# Patient Record
Sex: Female | Born: 1980 | Race: Black or African American | Hispanic: No | Marital: Married | Smoking: Never smoker
Health system: Southern US, Community
[De-identification: ages and names within clinical notes are randomized; demographics above are authoritative.]

## PROBLEM LIST (undated history)

## (undated) DIAGNOSIS — Z531 Procedure and treatment not carried out because of patient's decision for reasons of belief and group pressure: Secondary | ICD-10-CM

## (undated) DIAGNOSIS — L409 Psoriasis, unspecified: Secondary | ICD-10-CM

## (undated) DIAGNOSIS — IMO0001 Reserved for inherently not codable concepts without codable children: Secondary | ICD-10-CM

## (undated) HISTORY — PX: TONSILLECTOMY: SUR1361

## (undated) HISTORY — PX: WISDOM TOOTH EXTRACTION: SHX21

---

## 2013-06-02 ENCOUNTER — Ambulatory Visit (INDEPENDENT_AMBULATORY_CARE_PROVIDER_SITE_OTHER): Payer: BC Managed Care – PPO | Admitting: Surgery

## 2013-06-23 ENCOUNTER — Encounter (INDEPENDENT_AMBULATORY_CARE_PROVIDER_SITE_OTHER): Payer: Self-pay | Admitting: Surgery

## 2013-06-23 ENCOUNTER — Ambulatory Visit (INDEPENDENT_AMBULATORY_CARE_PROVIDER_SITE_OTHER): Payer: BC Managed Care – PPO | Admitting: Surgery

## 2013-06-23 DIAGNOSIS — E119 Type 2 diabetes mellitus without complications: Secondary | ICD-10-CM

## 2013-06-23 NOTE — Progress Notes (Signed)
Chief Complaint:  Morbid obesity BMI 43 with prediabetes  History of Present Illness:  Courtney Mcgee is an 33 y.o. female  who presents for consideration of bariatric surgery. She has completed her weight loss chart with weights ranging from 290 11/03/2017 over the past 2 years. Her BMI is range from 40-43. She has had at least 3 years about exercise without sustainable result. She has a letter from [employer] who is her first practitioner and no blood health concurs we'll further supporting her need for bariatric surgery to contribute to weight loss. Patient also presents her personal statement indicating her family history of strong for diabetes and her desire to not have further children are also to avoid the sustained ravages of long-term diabetes. She has tried multiple attempts with behavior modification Weight Watchers Adkins type diet Slim carried AT&TSlim fast again without sustainable result.  She's been to our  seminar and is interested in  having a sleeve gastrectomy.  I discussed the operation some detail with her and she would like to proceed.  She does not take blood transfusions.  History reviewed. No pertinent past medical history.  Past Surgical History  Procedure Laterality Date  . Cesarean section    . Tonsillectomy and adenoidectomy      Current Outpatient Prescriptions  Medication Sig Dispense Refill  . metFORMIN (GLUCOPHAGE-XR) 750 MG 24 hr tablet       . SPRINTEC 28 0.25-35 MG-MCG tablet        No current facility-administered medications for this visit.   Review of patient's allergies indicates no known allergies. Family History  Problem Relation Age of Onset  . Heart disease Father    Social History:   reports that she has never smoked. She does not have any smokeless tobacco history on file. She reports that she does not drink alcohol or use illicit drugs.   REVIEW OF SYSTEMS - PERTINENT POSITIVES ONLY: No history of DVT. Prior cesarean section. No  history of GERD  Physical Exam:   Blood pressure 128/80, pulse 67, temperature 97.8 F (36.6 C), resp. rate 16, height 6' (1.829 m), weight 319 lb (144.697 kg). Body mass index is 43.25 kg/(m^2).  Gen:  WDWN African American female NAD  Neurological: Alert and oriented to person, place, and time. Motor and sensory function is grossly intact  Head: Normocephalic and atraumatic.  Eyes: Conjunctivae are normal. Pupils are equal, round, and reactive to light. No scleral icterus.  Neck: Normal range of motion. Neck supple. No tracheal deviation or thyromegaly present.  Cardiovascular:  SR without murmurs or gallops.  No carotid bruits Respiratory: Effort normal.  No respiratory distress. No chest wall tenderness. Breath sounds normal.  No wheezes, rales or rhonchi.  Abdomen:  Obese nontender GU: Musculoskeletal: Normal range of motion. Extremities are nontender. No cyanosis, edema or clubbing noted Lymphadenopathy: No cervical, preauricular, postauricular or axillary adenopathy is present Skin: Skin is warm and dry. No rash noted. No diaphoresis. No erythema. No pallor. Pscyh: Normal mood and affect. Behavior is normal. Judgment and thought content normal.   LABORATORY RESULTS: No results found for this or any previous visit (from the past 48 hour(s)).  RADIOLOGY RESULTS: No results found.  Problem List: There are no active problems to display for this patient.   Assessment & Plan: For morbid obesity BMI 43 in a patient who would be a good candidate for sleeve gastrectomy. We'll proceed with workup.    Matt B. Daphine DeutscherMartin, MD, [facility]  [facility] [phone] beeper [phone]  06/23/2013 3:51 PM

## 2013-06-23 NOTE — Patient Instructions (Signed)
Sleeve Gastrectomy A sleeve gastrectomy is a surgery in which a large portion of the stomach is removed. After the surgery, the stomach will be a narrow tube about the size of a banana. This surgery is performed to help a person lose weight. The person loses weight because the reduced size of the stomach restricts the amount of food that the person can eat. The stomach will hold much less food than before the surgery. Also, the part of the stomach that is removed produces a hormone that causes hunger.  This surgery is done for people who have morbid obesity, defined as a body mass index (BMI) greater than 40. BMI is an estimate of body fat and is calculated from the height and weight of a person. This surgery may also be done for people with a BMI between 35 and 40 if they have other diseases, such as type 2 diabetes mellitus, obstructive sleep apnea, or heart and lung disorders (cardiopulmonary diseases).  LET YOUR HEALTH CARE PROVIDER KNOW ABOUT:  Any allergies you have.   All medicines you are taking, including vitamins, herbs, eyedrops, creams, and over-the-counter medicines.   Use of steroids (by mouth or creams).   Previous problems you or members of your family have had with the use of anesthetics.   Any blood disorders you have.   Previous surgeries you have had.   Possibility of pregnancy, if this applies.   Other health problems you have. RISKS AND COMPLICATIONS Generally, sleeve gastrectomy is a safe procedure. However, as with any procedure, complications can occur. Possible complications include:  Infection.  Bleeding.  Blood clots.  Damage to other organs or tissue.  Leakage of fluid from the stomach into the abdominal cavity (rare). BEFORE THE PROCEDURE  You may need to have blood tests and imaging tests (such as X-rays or ultrasonography) done before the day of surgery. A test to evaluate your esophagus and how it moves (esophageal manometry) may also be  done.  You may be placed on a liquid diet 2 3 weeks before the surgery.  Ask your health care provider about changing or stopping your regular medicines.  Do not eat or drink anything for at least 8 hours before the procedure.   Make plans to have someone drive you home after your hospital stay. Also arrange for someone to help you with activities during recovery. PROCEDURE  A laparoscopic technique is usually used for this surgery:  You will be given medicine to make you sleep through the procedure (general anesthetic). This medicine will be given through an intravenous (IV) access tube that is put into one of your veins.  Once you are asleep, your abdomen will be cleaned and sterilized.  Several small incisions will be made in your abdomen.  Your abdomen will be filled with air so that it expands. This gives the surgeon more room to operate and makes your organs easier to see.  A thin, lighted tube with a tiny camera on the end (laparoscope) is put through a small incision in your abdomen. The camera on the laparoscope sends a picture to a TV screen in the operating room. This gives the surgeon a good view inside the abdomen.  Hollow tubes are put through the other small incisions in your abdomen. The tools needed for the procedure are put through these tubes.  The surgeon uses staples to divide part of the stomach and then removes it through one of the incisions.  The remaining stomach may be reinforced using   stitches or surgical glue or both to prevent leakage of the stomach contents. A small tube (drain) may be placed through one of the incisions to allow extra fluid to flow from the area.  The incisions are closed with stitches, staples, or glue. AFTER THE PROCEDURE  You will be monitored closely in a recovery area. Once the anesthetic has worn off, you will likely be moved to a regular hospital room.  You will be given medicine for pain and nausea.   You may have a drain  from one of the incisions in your abdomen. If a drain is used, it may stay in place after you go home from the hospital and be removed at a follow-up appointment.   You will be encouraged to walk around several times a day. This helps prevent blood clots.  You will be started on a liquid diet the first day after your surgery. Sometimes a test is done to check for leaking before you can eat.  You will be urged to cough and do deep breathing exercises. This helps prevent a lung infection after a surgery.  You will likely need to stay in the hospital for a few days.  Document Released: 11/25/2008 Document Revised: 09/30/2012 Document Reviewed: 06/12/2012 ExitCare Patient Information 2014 [employer].  Two weeks prior to surgery  Go on the extremely low carb liquid diet One week prior to surgery  No aspirin products.  Tylenol is acceptable  Stop smoking 24 hours prior to surgery  No alcoholic beverages  Report fever greater than 100.5 or excessive nasal drainage suggesting infection  Continue bariatric preop diet  Perform bowel prep if ordered  Do not eat or drink anything after midnight the night before surgery  Do not take any medications except those instructed by the anesthesiologist Morning of surgery  Please arrive at the hospital at least 2 hours before your scheduled surgery time.  No makeup, fingernail polish or jewelry  Bring insurance cards with you  Bring your CPAP mask if you use this   

## 2013-07-08 ENCOUNTER — Encounter: Payer: Self-pay | Admitting: Dietician

## 2013-07-08 ENCOUNTER — Encounter: Payer: BC Managed Care – PPO | Attending: Surgery | Admitting: Dietician

## 2013-07-08 VITALS — Ht 72.0 in | Wt 315.7 lb

## 2013-07-08 DIAGNOSIS — Z01818 Encounter for other preprocedural examination: Secondary | ICD-10-CM | POA: Insufficient documentation

## 2013-07-08 DIAGNOSIS — E669 Obesity, unspecified: Secondary | ICD-10-CM

## 2013-07-08 DIAGNOSIS — Z713 Dietary counseling and surveillance: Secondary | ICD-10-CM | POA: Insufficient documentation

## 2013-07-08 NOTE — Progress Notes (Signed)
  Pre-Op Assessment Visit:  Pre-Operative Sleeve Gastrectomy Surgery  Medical Nutrition Therapy:  Appt start time: 1630   End time:  1700.  Patient was seen on 07/08/2013 for Pre-Operative Sleeve Gastrectomy Nutrition Assessment. Assessment and letter of approval faxed to [facility] Bariatric Surgery Program coordinator on 07/08/2013.   Preferred Learning Style:   No preference indicated   Learning Readiness:   Ready  Handouts given during visit include:  Pre-Op Goals Bariatric Surgery Protein Shakes  Teaching Method Utilized:  Visual Auditory Hands on  Barriers to learning/adherence to lifestyle change: none  Demonstrated degree of understanding via:  Teach Back   Patient to call the [facility] to enroll in Pre-Op and Post-Op Nutrition Education when surgery date is scheduled.

## 2013-07-08 NOTE — Patient Instructions (Signed)
Start working on [employer]. Try protein shakes. Call [facility] once surgery is scheduled to enroll in Pre-Op Class.

## 2013-07-14 ENCOUNTER — Other Ambulatory Visit: Payer: Self-pay

## 2013-07-14 ENCOUNTER — Ambulatory Visit (HOSPITAL_COMMUNITY)
Admission: RE | Admit: 2013-07-14 | Discharge: 2013-07-14 | Disposition: A | Discharge status: Home / Self Care | Payer: BC Managed Care – PPO | Source: Ambulatory Visit | Attending: Surgery | Admitting: Surgery

## 2013-07-14 DIAGNOSIS — K8020 Calculus of gallbladder without cholecystitis without obstruction: Secondary | ICD-10-CM | POA: Insufficient documentation

## 2013-07-14 DIAGNOSIS — Z6841 Body Mass Index (BMI) 40.0 and over, adult: Secondary | ICD-10-CM | POA: Insufficient documentation

## 2013-07-14 DIAGNOSIS — E119 Type 2 diabetes mellitus without complications: Secondary | ICD-10-CM | POA: Insufficient documentation

## 2013-07-14 DIAGNOSIS — K7689 Other specified diseases of liver: Secondary | ICD-10-CM | POA: Insufficient documentation

## 2013-07-16 ENCOUNTER — Encounter (HOSPITAL_COMMUNITY)
Admission: RE | Disposition: A | Discharge status: Home / Self Care | Payer: Self-pay | Source: Ambulatory Visit | Attending: Surgery

## 2013-07-16 ENCOUNTER — Ambulatory Visit (HOSPITAL_COMMUNITY)
Admission: RE | Admit: 2013-07-16 | Discharge: 2013-07-16 | Disposition: A | Discharge status: Home / Self Care | Payer: BC Managed Care – PPO | Source: Ambulatory Visit | Attending: Surgery | Admitting: Surgery

## 2013-07-16 DIAGNOSIS — Z0189 Encounter for other specified special examinations: Secondary | ICD-10-CM | POA: Insufficient documentation

## 2013-07-16 HISTORY — PX: BREATH TEK H PYLORI: SHX5422

## 2013-07-16 LAB — CBC WITH DIFFERENTIAL/PLATELET
BASOS ABS: 0 10*3/uL (ref 0.0–0.1)
Basophils Relative: 0 % (ref 0–1)
Eosinophils Absolute: 0.2 10*3/uL (ref 0.0–0.7)
Eosinophils Relative: 2 % (ref 0–5)
HCT: 35.8 % — ABNORMAL LOW (ref 36.0–46.0)
Hemoglobin: 11.7 g/dL — ABNORMAL LOW (ref 12.0–15.0)
Lymphocytes Relative: 27 % (ref 12–46)
Lymphs Abs: 2.8 10*3/uL (ref 0.7–4.0)
MCH: 27.3 pg (ref 26.0–34.0)
MCHC: 32.7 g/dL (ref 30.0–36.0)
MCV: 83.6 fL (ref 78.0–100.0)
MONO ABS: 0.3 10*3/uL (ref 0.1–1.0)
Monocytes Relative: 3 % (ref 3–12)
Neutro Abs: 6.9 10*3/uL (ref 1.7–7.7)
Neutrophils Relative %: 68 % (ref 43–77)
PLATELETS: 217 10*3/uL (ref 150–400)
RBC: 4.28 MIL/uL (ref 3.87–5.11)
RDW: 14.3 % (ref 11.5–15.5)
WBC: 10.2 10*3/uL (ref 4.0–10.5)

## 2013-07-16 LAB — COMPREHENSIVE METABOLIC PANEL
ALK PHOS: 74 U/L (ref 39–117)
ALT: 17 U/L (ref 0–35)
AST: 12 U/L (ref 0–37)
Albumin: 3.7 g/dL (ref 3.5–5.2)
BUN: 10 mg/dL (ref 6–23)
CO2: 23 mEq/L (ref 19–32)
CREATININE: 0.74 mg/dL (ref 0.50–1.10)
Calcium: 8.9 mg/dL (ref 8.4–10.5)
Chloride: 105 mEq/L (ref 96–112)
Glucose, Bld: 84 mg/dL (ref 70–99)
Potassium: 4.1 mEq/L (ref 3.5–5.3)
Sodium: 140 mEq/L (ref 135–145)
Total Bilirubin: 0.4 mg/dL (ref 0.2–1.2)
Total Protein: 7 g/dL (ref 6.0–8.3)

## 2013-07-16 LAB — TSH: TSH: 1.879 u[IU]/mL (ref 0.350–4.500)

## 2013-07-16 LAB — HEMOGLOBIN A1C
Hgb A1c MFr Bld: 5.3 % (ref <5.7)
MEAN PLASMA GLUCOSE: 105 mg/dL (ref <117)

## 2013-07-16 LAB — T4: T4, Total: 12.7 ug/dL — ABNORMAL HIGH (ref 5.0–12.5)

## 2013-07-16 SURGERY — BREATH TEST, FOR HELICOBACTER PYLORI

## 2013-07-16 NOTE — Progress Notes (Signed)
07/16/13 1041  BREATH TEK ASSESSMENT  Referring MD Luretha Murphy  Time of Last PO Intake 2100 (on 07/15/2013)  Baseline Breath At: 1000  Pranactin Given At: 1001  Post-Dose Breath At: 1016  Sample 1 1.2  Sample 2 1.2  Test Negative

## 2013-07-17 LAB — HCG, SERUM, QUALITATIVE: Preg, Serum: NEGATIVE

## 2013-07-19 ENCOUNTER — Encounter (HOSPITAL_COMMUNITY): Payer: Self-pay | Admitting: Surgery

## 2013-08-04 ENCOUNTER — Telehealth: Payer: Self-pay | Admitting: Dietician

## 2013-08-04 NOTE — Telephone Encounter (Signed)
The pre op diet handout was emailed to the patient on 08/04/2013. She was instructed to begin the pre op diet at 2 weeks prior to surgery.

## 2013-08-09 ENCOUNTER — Ambulatory Visit: Payer: Self-pay | Admitting: Dietician

## 2013-08-12 NOTE — Progress Notes (Signed)
Need orders in EPIC.  Surgery on 08/30/13.  proep on 08/20/13 at 230pm.  Thank You.

## 2013-08-13 ENCOUNTER — Encounter (HOSPITAL_COMMUNITY): Payer: Self-pay | Admitting: Pharmacy Technician

## 2013-08-19 ENCOUNTER — Other Ambulatory Visit (HOSPITAL_COMMUNITY): Payer: Self-pay | Admitting: *Deleted

## 2013-08-19 NOTE — Patient Instructions (Addendum)
Courtney Mcgee  08/19/2013                           YOUR PROCEDURE IS SCHEDULED ON: 08/30/13 AT 10:45 AM               ENTER THRU  MAIN HOSPITAL ENTRANCE AND                            FOLLOW  SIGNS TO SHORT STAY CENTER                 ARRIVE AT SHORT STAY AT: 8:45 AM               CALL THIS NUMBER IF ANY PROBLEMS THE DAY OF SURGERY :               [phone]                                REMEMBER:   Do not eat food or drink liquids AFTER MIDNIGHT                 Take these medicines the morning of surgery with               A SIPS OF WATER : BIRTH CONTROL PILL        Do not wear jewelry, make-up   Do not wear lotions, powders, or perfumes.   Do not shave legs or underarms 12 hrs. before surgery (men may shave face)  Do not bring valuables to the hospital.  Contacts, dentures or bridgework may not be worn into surgery.  Leave suitcase in the car. After surgery it may be brought to your room.  For patients admitted to the hospital more than one night, checkout time is            11:00 AM                                         ________________________________________________________________________                                                         [place] - PREPARING FOR SURGERY  Before surgery, you can play an important role.  Because skin is not sterile, your skin needs to be as free of germs as possible.  You can reduce the number of germs on your skin by washing with CHG (chlorahexidine gluconate) soap before surgery.  CHG is an antiseptic cleaner which kills germs and bonds with the skin to continue killing germs even after washing. Please DO NOT use if you have an allergy to CHG or antibacterial soaps.  If your skin becomes reddened/irritated stop using the CHG and inform your nurse when you arrive at Short Stay. Do not shave (including legs and underarms) for at least 48 hours prior to the first CHG shower.  You may shave your  face. Please follow these instructions carefully:   1.  Shower with CHG Soap the night before surgery and the  morning of Surgery.  2.  If you choose to wash your hair, wash your hair first as usual with your  normal  Shampoo.   3.  After you shampoo, rinse your hair and body thoroughly to remove the  shampoo.                                         4.  Use CHG as you would any other liquid soap.  You can apply chg directly  to the skin and wash . Gently wash with scrungie or clean wascloth    5.  Apply the CHG Soap to your body ONLY FROM THE NECK DOWN.   Do not use on open                           Wound or open sores. Avoid contact with eyes, ears mouth and genitals (private parts).                        Genitals (private parts) with your normal soap.              6.  Wash thoroughly, paying special attention to the area where your surgery  will be performed.   7.  Thoroughly rinse your body with warm water from the neck down.   8.  DO NOT shower/wash with your normal soap after using and rinsing off  the CHG Soap .                9.  Pat yourself dry with a clean towel.             10.  Wear clean pajamas.             11.  Place clean sheets on your bed the night of your first shower and do not  sleep with pets.  Day of Surgery : Do not apply any lotions/deodorants the morning of surgery.  Please wear clean clothes to the hospital/surgery center.  FAILURE TO FOLLOW THESE INSTRUCTIONS MAY RESULT IN THE CANCELLATION OF YOUR SURGERY    PATIENT SIGNATURE_________________________________  ______________________________________________________________________

## 2013-08-20 ENCOUNTER — Encounter (HOSPITAL_COMMUNITY): Payer: Self-pay

## 2013-08-20 ENCOUNTER — Encounter (HOSPITAL_COMMUNITY)
Admission: RE | Admit: 2013-08-20 | Discharge: 2013-08-20 | Disposition: A | Discharge status: Home / Self Care | Payer: BC Managed Care – PPO | Source: Ambulatory Visit | Attending: Surgery | Admitting: Surgery

## 2013-08-20 DIAGNOSIS — Z01812 Encounter for preprocedural laboratory examination: Secondary | ICD-10-CM | POA: Insufficient documentation

## 2013-08-20 HISTORY — DX: Procedure and treatment not carried out because of patient's decision for reasons of belief and group pressure: Z53.1

## 2013-08-20 HISTORY — DX: Reserved for inherently not codable concepts without codable children: IMO0001

## 2013-08-20 HISTORY — DX: Psoriasis, unspecified: L40.9

## 2013-08-20 LAB — CBC
HCT: 36.1 % (ref 36.0–46.0)
HEMOGLOBIN: 11.6 g/dL — AB (ref 12.0–15.0)
MCH: 27.6 pg (ref 26.0–34.0)
MCHC: 32.1 g/dL (ref 30.0–36.0)
MCV: 86 fL (ref 78.0–100.0)
Platelets: 208 10*3/uL (ref 150–400)
RBC: 4.2 MIL/uL (ref 3.87–5.11)
RDW: 13.9 % (ref 11.5–15.5)
WBC: 10.1 10*3/uL (ref 4.0–10.5)

## 2013-08-20 LAB — COMPREHENSIVE METABOLIC PANEL
ALK PHOS: 73 U/L (ref 39–117)
ALT: 19 U/L (ref 0–35)
AST: 16 U/L (ref 0–37)
Albumin: 3.4 g/dL — ABNORMAL LOW (ref 3.5–5.2)
Anion gap: 15 (ref 5–15)
BUN: 14 mg/dL (ref 6–23)
CO2: 24 mEq/L (ref 19–32)
Calcium: 9.3 mg/dL (ref 8.4–10.5)
Chloride: 102 mEq/L (ref 96–112)
Creatinine, Ser: 0.69 mg/dL (ref 0.50–1.10)
GFR calc Af Amer: >90 mL/min (ref >90)
GFR calc non Af Amer: >90 mL/min (ref >90)
Glucose, Bld: 82 mg/dL (ref 70–99)
POTASSIUM: 4.2 meq/L (ref 3.7–5.3)
SODIUM: 141 meq/L (ref 137–147)
Total Protein: 7.7 g/dL (ref 6.0–8.3)

## 2013-08-20 LAB — HCG, SERUM, QUALITATIVE: Preg, Serum: NEGATIVE

## 2013-08-20 NOTE — Progress Notes (Signed)
PT is Jehovahs witness - no  Blood products for signed - noted in EPIC - pt states Dr. Daphine DeutscherMartin aware

## 2013-08-23 ENCOUNTER — Encounter: Payer: BC Managed Care – PPO | Attending: Surgery

## 2013-08-23 DIAGNOSIS — Z713 Dietary counseling and surveillance: Secondary | ICD-10-CM | POA: Insufficient documentation

## 2013-08-23 DIAGNOSIS — Z01818 Encounter for other preprocedural examination: Secondary | ICD-10-CM | POA: Insufficient documentation

## 2013-08-23 LAB — NO BLOOD PRODUCTS

## 2013-08-24 ENCOUNTER — Encounter (INDEPENDENT_AMBULATORY_CARE_PROVIDER_SITE_OTHER): Payer: Self-pay | Admitting: Surgery

## 2013-08-24 ENCOUNTER — Ambulatory Visit (INDEPENDENT_AMBULATORY_CARE_PROVIDER_SITE_OTHER): Payer: BC Managed Care – PPO | Admitting: Surgery

## 2013-08-24 VITALS — BP 126/80 | HR 74 | Temp 98.0°F | Ht 72.0 in | Wt 309.0 lb

## 2013-08-24 DIAGNOSIS — K8020 Calculus of gallbladder without cholecystitis without obstruction: Secondary | ICD-10-CM

## 2013-08-24 MED ORDER — HYDROCODONE-ACETAMINOPHEN 7.5-325 MG/15ML PO SOLN: 15.0000 mL | ORAL | Status: DC | PRN

## 2013-08-24 NOTE — Progress Notes (Signed)
Chief Complaint:  Morbid obesity weight 308 BMI 42  History of Present Illness:  Courtney Mcgee is an 33 y.o. female who is ready for her upcoming sleeve gastrectomy on Monday.  She is aware of the operation and its risks and benefits.  She is a Scientist, product/process development.  She did have a gallstone on her preop ultrasound.    Past Medical History  Diagnosis Date  . Psoriasis   . Refusal of blood transfusions as patient is Jehovah's Witness     Past Surgical History  Procedure Laterality Date  . Cesarean section    . Breath tek h pylori N/A 07/16/2013    Procedure: BREATH TEK H PYLORI;  Surgeon: Valarie Merino, MD;  Location: [facility];  Service: General;  Laterality: N/A;  . Tonsillectomy    . Wisdom tooth extraction      Current Outpatient Prescriptions  Medication Sig Dispense Refill  . cetirizine (ZYRTEC) 10 MG tablet Take 10 mg by mouth daily as needed for allergies.      Marland Kitchen HYDROcodone-acetaminophen (NORCO/VICODIN) 5-325 MG per tablet Take 1 tablet by mouth every 6 (six) hours as needed (Cycle pain).      Marland Kitchen ibuprofen (ADVIL,MOTRIN) 800 MG tablet Take 800 mg by mouth every 8 (eight) hours as needed (Pain).      . SPRINTEC 28 0.25-35 MG-MCG tablet Take 1 tablet by mouth daily.       . Vitamin D, Ergocalciferol, (DRISDOL) 50000 UNITS CAPS capsule Take 50,000 Units by mouth every 7 (seven) days. Thursdays      . HYDROcodone-acetaminophen (HYCET) 7.5-325 mg/15 ml solution Take 15 mLs by mouth every 4 (four) hours as needed for moderate pain.  300 mL  0   No current facility-administered medications for this visit.   Review of patient's allergies indicates no known allergies. Family History  Problem Relation Age of Onset  . Heart disease Father   . Hypertension Other   . Hyperlipidemia Other   . Heart disease Other   . Diabetes Other    Social History:   reports that she has never smoked. She does not have any smokeless tobacco history on file. She reports that she does not drink  alcohol or use illicit drugs.   REVIEW OF SYSTEMS : Positive for gallstones ; otherwise negative  Physical Exam:   Blood pressure 126/80, pulse 74, temperature 98 F (36.7 C), height 6' (1.829 m), weight 309 lb (140.161 kg), last menstrual period 08/18/2013. Body mass index is 41.9 kg/(m^2).  Gen:  WDWN AAF NAD  Neurological: Alert and oriented to person, place, and time. Motor and sensory function is grossly intact  Head: Normocephalic and atraumatic.  Eyes: Conjunctivae are normal. Pupils are equal, round, and reactive to light. No scleral icterus.  Neck: Normal range of motion. Neck supple. No tracheal deviation or thyromegaly present.  Cardiovascular:  SR without murmurs or gallops.  No carotid bruits Breast:  Not examined Respiratory: Effort normal.  No respiratory distress. No chest wall tenderness. Breath sounds normal.  No wheezes, rales or rhonchi.  Abdomen:  obese GU:  Not examined Musculoskeletal: Normal range of motion. Extremities are nontender. No cyanosis, edema or clubbing noted Lymphadenopathy: No cervical, preauricular, postauricular or axillary adenopathy is present Skin: Skin is warm and dry. No rash noted. No diaphoresis. No erythema. No pallor. Pscyh: Normal mood and affect. Behavior is normal. Judgment and thought content normal.   LABORATORY RESULTS: No results found for this or any previous visit (from the past  48 hour(s)).   RADIOLOGY RESULTS: No results found.  Problem List: There are no active problems to display for this patient.   Assessment & Plan: Morbid obesity for sleeve gastrectomy.  Normal UGI series.      Matt B. Daphine DeutscherMartin, MD, [facility]  [facility] [phone] beeper [phone]  08/24/2013 11:24 AM

## 2013-08-24 NOTE — Patient Instructions (Signed)

## 2013-08-25 NOTE — Progress Notes (Signed)
  Pre-Operative Nutrition Class:  Appt start time: 4718   End time:  1830.  Patient was seen on 08/23/13 for Pre-Operative Bariatric Surgery Education at [facility].   Surgery date: 08/30/13 Surgery type: gastric sleeve Start weight at [facility]: 316 lbs on 07/08/13 Weight today: 309.5 lbs  TANITA  BODY COMP RESULTS  08/23/13   BMI (kg/m^2) 42   Fat Mass (lbs) 159   Fat Free Mass (lbs) 150.5   Total Body Water (lbs) 110   Samples given per MNT protocol. Patient educated on appropriate usage: Premier protein shake (strawberry - qty 1) Lot #: [id number] Exp: 06/2014  Bariatric Advantage Calcium Citrate (chocolate - qty 1) Lot #: [id number] Exp: 07/2014  Celebrate Vitamins Multivitamin (orange - qty 1) Lot #: [id number] Exp: 03/2014  Renee Pain Protein Powder (vanilla - qty 1) Lot #: [id number] Exp: 11/2014  The following the learning objectives were met by the patient during this course:  Identify Pre-Op Dietary Goals and will begin 2 weeks pre-operatively  Identify appropriate sources of fluids and proteins   State protein recommendations and appropriate sources pre and post-operatively  Identify Post-Operative Dietary Goals and will follow for 2 weeks post-operatively  Identify appropriate multivitamin and calcium sources  Describe the need for physical activity post-operatively and will follow MD recommendations  State when to call healthcare provider regarding medication questions or post-operative complications  Handouts given during class include:  Pre-Op Bariatric Surgery Diet Handout  Protein Shake Handout  Post-Op Bariatric Surgery Nutrition Handout  BELT Program Information Flyer  Support Group Information Flyer  [facility] Bariatric Supplements Price List  Follow-Up Plan: Patient will follow-up at [facility] 2 weeks post operatively for diet advancement per MD.

## 2013-08-30 ENCOUNTER — Encounter (HOSPITAL_COMMUNITY): Payer: BC Managed Care – PPO | Admitting: *Deleted

## 2013-08-30 ENCOUNTER — Inpatient Hospital Stay (HOSPITAL_COMMUNITY)
Admission: RE | Admit: 2013-08-30 | Discharge: 2013-09-01 | DRG: 621 | Disposition: A | Discharge status: Home / Self Care | Payer: BC Managed Care – PPO | Source: Ambulatory Visit | Attending: Surgery | Admitting: Surgery

## 2013-08-30 ENCOUNTER — Ambulatory Visit (HOSPITAL_COMMUNITY): Payer: BC Managed Care – PPO | Admitting: *Deleted

## 2013-08-30 ENCOUNTER — Inpatient Hospital Stay (HOSPITAL_COMMUNITY): Admission: RE | Admit: 2013-08-30 | Payer: BC Managed Care – PPO | Source: Ambulatory Visit | Admitting: Surgery

## 2013-08-30 ENCOUNTER — Encounter (HOSPITAL_COMMUNITY): Payer: Self-pay | Admitting: *Deleted

## 2013-08-30 ENCOUNTER — Encounter (HOSPITAL_COMMUNITY)
Admission: RE | Disposition: A | Discharge status: Home / Self Care | Payer: Self-pay | Source: Ambulatory Visit | Attending: Surgery

## 2013-08-30 DIAGNOSIS — Z791 Long term (current) use of non-steroidal anti-inflammatories (NSAID): Secondary | ICD-10-CM

## 2013-08-30 DIAGNOSIS — Z79899 Other long term (current) drug therapy: Secondary | ICD-10-CM

## 2013-08-30 DIAGNOSIS — Z9049 Acquired absence of other specified parts of digestive tract: Secondary | ICD-10-CM

## 2013-08-30 DIAGNOSIS — K8020 Calculus of gallbladder without cholecystitis without obstruction: Secondary | ICD-10-CM | POA: Diagnosis present

## 2013-08-30 DIAGNOSIS — L408 Other psoriasis: Secondary | ICD-10-CM | POA: Diagnosis present

## 2013-08-30 DIAGNOSIS — Z6841 Body Mass Index (BMI) 40.0 and over, adult: Secondary | ICD-10-CM

## 2013-08-30 DIAGNOSIS — Z833 Family history of diabetes mellitus: Secondary | ICD-10-CM

## 2013-08-30 DIAGNOSIS — Z8249 Family history of ischemic heart disease and other diseases of the circulatory system: Secondary | ICD-10-CM

## 2013-08-30 DIAGNOSIS — Z9884 Bariatric surgery status: Secondary | ICD-10-CM

## 2013-08-30 DIAGNOSIS — Z01812 Encounter for preprocedural laboratory examination: Secondary | ICD-10-CM

## 2013-08-30 HISTORY — PX: LAPAROSCOPIC GASTRIC SLEEVE RESECTION: SHX5895

## 2013-08-30 LAB — HEMOGLOBIN AND HEMATOCRIT, BLOOD
HCT: 36.2 % (ref 36.0–46.0)
Hemoglobin: 11.8 g/dL — ABNORMAL LOW (ref 12.0–15.0)

## 2013-08-30 LAB — CREATININE, SERUM
Creatinine, Ser: 0.75 mg/dL (ref 0.50–1.10)
GFR calc Af Amer: >90 mL/min (ref >90)
GFR calc non Af Amer: >90 mL/min (ref >90)

## 2013-08-30 SURGERY — GASTRECTOMY, SLEEVE, LAPAROSCOPIC
Anesthesia: General | Site: Abdomen

## 2013-08-30 MED ORDER — MIDAZOLAM HCL 2 MG/2ML IJ SOLN
INTRAMUSCULAR | Status: AC
  Filled 2013-08-30: qty 2

## 2013-08-30 MED ORDER — ONDANSETRON HCL 4 MG/2ML IJ SOLN
INTRAMUSCULAR | Status: AC
  Filled 2013-08-30: qty 2

## 2013-08-30 MED ORDER — UNJURY VANILLA POWDER
2.0000 [oz_av] | Freq: Four times a day (QID) | ORAL | Status: DC
  Administered 2013-09-01: 2 [oz_av] via ORAL

## 2013-08-30 MED ORDER — LACTATED RINGERS IV SOLN: INTRAVENOUS | Status: DC

## 2013-08-30 MED ORDER — CEFOXITIN SODIUM 2 G IV SOLR
2.0000 g | INTRAVENOUS | Status: AC
  Administered 2013-08-30: 2 g via INTRAVENOUS

## 2013-08-30 MED ORDER — BUPIVACAINE LIPOSOME 1.3 % IJ SUSP
20.0000 mL | Freq: Once | INTRAMUSCULAR | Status: DC
  Filled 2013-08-30: qty 20

## 2013-08-30 MED ORDER — UNJURY CHICKEN SOUP POWDER
2.0000 [oz_av] | Freq: Four times a day (QID) | ORAL | Status: DC
  Administered 2013-09-01: 2 [oz_av] via ORAL

## 2013-08-30 MED ORDER — LACTATED RINGERS IR SOLN
Status: DC | PRN
  Administered 2013-08-30: 3000 mL

## 2013-08-30 MED ORDER — ONDANSETRON HCL 4 MG/2ML IJ SOLN: 4.0000 mg | INTRAMUSCULAR | Status: DC | PRN

## 2013-08-30 MED ORDER — UNJURY CHOCOLATE CLASSIC POWDER: 2.0000 [oz_av] | Freq: Four times a day (QID) | ORAL | Status: DC

## 2013-08-30 MED ORDER — DEXTROSE 5 % IV SOLN
INTRAVENOUS | Status: AC
  Filled 2013-08-30: qty 2

## 2013-08-30 MED ORDER — PROPOFOL 10 MG/ML IV BOLUS
INTRAVENOUS | Status: AC
  Filled 2013-08-30: qty 20

## 2013-08-30 MED ORDER — ACETAMINOPHEN 160 MG/5ML PO SOLN: 650.0000 mg | ORAL | Status: DC | PRN

## 2013-08-30 MED ORDER — NEOSTIGMINE METHYLSULFATE 10 MG/10ML IV SOLN
INTRAVENOUS | Status: DC | PRN
  Administered 2013-08-30: 4 mg via INTRAVENOUS

## 2013-08-30 MED ORDER — METOCLOPRAMIDE HCL 5 MG/ML IJ SOLN
INTRAMUSCULAR | Status: DC | PRN
  Administered 2013-08-30: 10 mg via INTRAVENOUS

## 2013-08-30 MED ORDER — ROCURONIUM BROMIDE 100 MG/10ML IV SOLN
INTRAVENOUS | Status: DC | PRN
  Administered 2013-08-30: 10 mg via INTRAVENOUS
  Administered 2013-08-30: 5 mg via INTRAVENOUS
  Administered 2013-08-30: 10 mg via INTRAVENOUS
  Administered 2013-08-30: 5 mg via INTRAVENOUS
  Administered 2013-08-30: 50 mg via INTRAVENOUS

## 2013-08-30 MED ORDER — MIDAZOLAM HCL 5 MG/5ML IJ SOLN
INTRAMUSCULAR | Status: DC | PRN
  Administered 2013-08-30 (×2): 1 mg via INTRAVENOUS

## 2013-08-30 MED ORDER — ESMOLOL HCL 10 MG/ML IV SOLN
INTRAVENOUS | Status: DC | PRN
  Administered 2013-08-30 (×2): 20 mg via INTRAVENOUS

## 2013-08-30 MED ORDER — ACETAMINOPHEN 160 MG/5ML PO SOLN
325.0000 mg | ORAL | Status: DC | PRN
  Administered 2013-08-31: 325 mg via ORAL
  Filled 2013-08-30: qty 20.3

## 2013-08-30 MED ORDER — BUPIVACAINE LIPOSOME 1.3 % IJ SUSP
INTRAMUSCULAR | Status: DC | PRN
  Administered 2013-08-30: 20 mL

## 2013-08-30 MED ORDER — 0.9 % SODIUM CHLORIDE (POUR BTL) OPTIME
TOPICAL | Status: DC | PRN
  Administered 2013-08-30: 1000 mL

## 2013-08-30 MED ORDER — LIDOCAINE HCL (CARDIAC) 20 MG/ML IV SOLN
INTRAVENOUS | Status: DC | PRN
  Administered 2013-08-30: 50 mg via INTRAVENOUS

## 2013-08-30 MED ORDER — HYDROMORPHONE HCL PF 1 MG/ML IJ SOLN: 0.2500 mg | INTRAMUSCULAR | Status: DC | PRN

## 2013-08-30 MED ORDER — ROCURONIUM BROMIDE 100 MG/10ML IV SOLN
INTRAVENOUS | Status: AC
  Filled 2013-08-30: qty 1

## 2013-08-30 MED ORDER — GLYCOPYRROLATE 0.2 MG/ML IJ SOLN
INTRAMUSCULAR | Status: DC | PRN
  Administered 2013-08-30: .6 mg via INTRAVENOUS

## 2013-08-30 MED ORDER — HEPARIN SODIUM (PORCINE) 5000 UNIT/ML IJ SOLN
5000.0000 [IU] | INTRAMUSCULAR | Status: AC
  Administered 2013-08-30: 5000 [IU] via SUBCUTANEOUS
  Filled 2013-08-30: qty 1

## 2013-08-30 MED ORDER — FENTANYL CITRATE 0.05 MG/ML IJ SOLN
INTRAMUSCULAR | Status: DC | PRN
  Administered 2013-08-30 (×2): 50 ug via INTRAVENOUS
  Administered 2013-08-30: 100 ug via INTRAVENOUS
  Administered 2013-08-30: 50 ug via INTRAVENOUS
  Administered 2013-08-30: 100 ug via INTRAVENOUS
  Administered 2013-08-30 (×3): 50 ug via INTRAVENOUS
  Administered 2013-08-30: 100 ug via INTRAVENOUS
  Administered 2013-08-30: 50 ug via INTRAVENOUS

## 2013-08-30 MED ORDER — CHLORHEXIDINE GLUCONATE 0.12 % MT SOLN
15.0000 mL | Freq: Two times a day (BID) | OROMUCOSAL | Status: DC
  Administered 2013-08-30 – 2013-08-31 (×2): 15 mL via OROMUCOSAL
  Filled 2013-08-30 (×6): qty 15

## 2013-08-30 MED ORDER — LACTATED RINGERS IV SOLN
INTRAVENOUS | Status: DC
  Administered 2013-08-30: 1000 mL via INTRAVENOUS

## 2013-08-30 MED ORDER — BIOTENE DRY MOUTH MT LIQD
15.0000 mL | Freq: Two times a day (BID) | OROMUCOSAL | Status: DC
  Administered 2013-08-30 – 2013-08-31 (×3): 15 mL via OROMUCOSAL

## 2013-08-30 MED ORDER — HEPARIN SODIUM (PORCINE) 5000 UNIT/ML IJ SOLN
5000.0000 [IU] | Freq: Three times a day (TID) | INTRAMUSCULAR | Status: DC
  Administered 2013-08-30 – 2013-09-01 (×6): 5000 [IU] via SUBCUTANEOUS
  Filled 2013-08-30 (×8): qty 1

## 2013-08-30 MED ORDER — FENTANYL CITRATE 0.05 MG/ML IJ SOLN
INTRAMUSCULAR | Status: AC
  Filled 2013-08-30: qty 5

## 2013-08-30 MED ORDER — DEXAMETHASONE SODIUM PHOSPHATE 10 MG/ML IJ SOLN
INTRAMUSCULAR | Status: DC | PRN
  Administered 2013-08-30: 5 mg via INTRAVENOUS

## 2013-08-30 MED ORDER — MORPHINE SULFATE 2 MG/ML IJ SOLN
2.0000 mg | INTRAMUSCULAR | Status: DC | PRN
  Administered 2013-08-30: 6 mg via INTRAVENOUS
  Administered 2013-08-30: 2 mg via INTRAVENOUS
  Administered 2013-08-30 (×2): 6 mg via INTRAVENOUS
  Administered 2013-08-30: 4 mg via INTRAVENOUS
  Administered 2013-08-30 – 2013-08-31 (×7): 6 mg via INTRAVENOUS
  Filled 2013-08-30: qty 1
  Filled 2013-08-30: qty 2
  Filled 2013-08-30 (×10): qty 3

## 2013-08-30 MED ORDER — LACTATED RINGERS IV SOLN
INTRAVENOUS | Status: DC | PRN
  Administered 2013-08-30 (×2): via INTRAVENOUS

## 2013-08-30 MED ORDER — PROMETHAZINE HCL 25 MG/ML IJ SOLN: 6.2500 mg | INTRAMUSCULAR | Status: DC | PRN

## 2013-08-30 MED ORDER — LIDOCAINE HCL (CARDIAC) 20 MG/ML IV SOLN
INTRAVENOUS | Status: AC
  Filled 2013-08-30: qty 5

## 2013-08-30 MED ORDER — KCL IN DEXTROSE-NACL 20-5-0.45 MEQ/L-%-% IV SOLN
INTRAVENOUS | Status: DC
  Administered 2013-08-30 – 2013-09-01 (×3): via INTRAVENOUS
  Filled 2013-08-30 (×7): qty 1000

## 2013-08-30 MED ORDER — ONDANSETRON HCL 4 MG/2ML IJ SOLN
INTRAMUSCULAR | Status: DC | PRN
  Administered 2013-08-30: 4 mg via INTRAVENOUS

## 2013-08-30 MED ORDER — OXYCODONE HCL 5 MG/5ML PO SOLN
5.0000 mg | ORAL | Status: DC | PRN
  Administered 2013-08-31: 5 mg via ORAL
  Administered 2013-08-31: 10 mg via ORAL
  Administered 2013-08-31: 5 mg via ORAL
  Administered 2013-08-31 – 2013-09-01 (×4): 10 mg via ORAL
  Filled 2013-08-30 (×2): qty 5
  Filled 2013-08-30 (×3): qty 10
  Filled 2013-08-30: qty 50
  Filled 2013-08-30: qty 10

## 2013-08-30 MED ORDER — PROPOFOL 10 MG/ML IV BOLUS
INTRAVENOUS | Status: DC | PRN
  Administered 2013-08-30: 230 mg via INTRAVENOUS

## 2013-08-30 SURGICAL SUPPLY — 65 items
APPLICATOR COTTON TIP 6IN STRL (MISCELLANEOUS) IMPLANT
APPLIER CLIP ROT 10 11.4 M/L (STAPLE)
APPLIER CLIP ROT 13.4 12 LRG (CLIP)
BLADE HEX COATED 2.75 (ELECTRODE) IMPLANT
BLADE SURG 15 STRL LF DISP TIS (BLADE) ×1 IMPLANT
BLADE SURG 15 STRL SS (BLADE) ×1
CABLE HIGH FREQUENCY MONO STRZ (ELECTRODE) IMPLANT
CLIP APPLIE ROT 10 11.4 M/L (STAPLE) IMPLANT
CLIP APPLIE ROT 13.4 12 LRG (CLIP) IMPLANT
DERMABOND ADVANCED (GAUZE/BANDAGES/DRESSINGS) ×1
DERMABOND ADVANCED .7 DNX12 (GAUZE/BANDAGES/DRESSINGS) ×1 IMPLANT
DEVICE SUT QUICK LOAD TK 5 (STAPLE) IMPLANT
DEVICE SUT TI-KNOT TK 5X26 (MISCELLANEOUS) IMPLANT
DEVICE SUTURE ENDOST 10MM (ENDOMECHANICALS) IMPLANT
DEVICE TROCAR PUNCTURE CLOSURE (ENDOMECHANICALS) ×2 IMPLANT
DISSECTOR BLUNT TIP ENDO 5MM (MISCELLANEOUS) IMPLANT
DRAIN CHANNEL 19F RND (DRAIN) IMPLANT
DRAPE CAMERA CLOSED 9X96 (DRAPES) ×2 IMPLANT
ELECT REM PT RETURN 9FT ADLT (ELECTROSURGICAL) ×2
ELECTRODE REM PT RTRN 9FT ADLT (ELECTROSURGICAL) ×1 IMPLANT
ENDO GIA UNIVERSAL XLG (ENDOMECHANICALS) IMPLANT
EVACUATOR SILICONE 100CC (DRAIN) IMPLANT
GAUZE SPONGE 4X4 12PLY STRL (GAUZE/BANDAGES/DRESSINGS) IMPLANT
GLOVE BIOGEL M 8.0 STRL (GLOVE) ×2 IMPLANT
GLOVE BIOGEL PI IND STRL 7.5 (GLOVE) ×1 IMPLANT
GLOVE BIOGEL PI INDICATOR 7.5 (GLOVE) ×1
GLOVE SURG SIGNA 7.5 PF LTX (GLOVE) ×2 IMPLANT
GLOVE SURG SS PI 6.5 STRL IVOR (GLOVE) ×8 IMPLANT
GLOVE SURG SS PI 7.0 STRL IVOR (GLOVE) ×2 IMPLANT
GOWN STRL REUS W/TWL XL LVL3 (GOWN DISPOSABLE) ×6 IMPLANT
GOWN STRL REUS W/TWL XL LVL4 (GOWN DISPOSABLE) ×4 IMPLANT
HOVERMATT SINGLE USE (MISCELLANEOUS) ×2 IMPLANT
KIT BASIN OR (CUSTOM PROCEDURE TRAY) ×2 IMPLANT
NEEDLE SPNL 22GX3.5 QUINCKE BK (NEEDLE) ×2 IMPLANT
PACK UNIVERSAL I (CUSTOM PROCEDURE TRAY) ×2 IMPLANT
PEN SKIN MARKING BROAD (MISCELLANEOUS) ×2 IMPLANT
RELOAD ENDO STITCH (ENDOMECHANICALS) IMPLANT
RELOAD TRI 45 ART MED THCK BLK (STAPLE) ×2 IMPLANT
RELOAD TRI 45 ART MED THCK PUR (STAPLE) IMPLANT
RELOAD TRI 60 ART MED THCK BLK (STAPLE) ×4 IMPLANT
RELOAD TRI 60 ART MED THCK PUR (STAPLE) ×8 IMPLANT
SCISSORS LAP 5X45 EPIX DISP (ENDOMECHANICALS) ×2 IMPLANT
SCRUB PCMX 4 OZ (MISCELLANEOUS) ×4 IMPLANT
SEALANT SURGICAL APPL DUAL CAN (MISCELLANEOUS) IMPLANT
SET IRRIG TUBING LAPAROSCOPIC (IRRIGATION / IRRIGATOR) ×2 IMPLANT
SLEEVE ADV FIXATION 12X100MM (TROCAR) IMPLANT
SLEEVE ADV FIXATION 5X100MM (TROCAR) ×4 IMPLANT
SLEEVE GASTRECTOMY 36FR VISIGI (MISCELLANEOUS) ×2 IMPLANT
SOLUTION ANTI FOG 6CC (MISCELLANEOUS) ×2 IMPLANT
SPONGE LAP 18X18 X RAY DECT (DISPOSABLE) ×2 IMPLANT
STAPLER VISISTAT 35W (STAPLE) ×2 IMPLANT
SUT ETHILON 2 0 PS N (SUTURE) IMPLANT
SUT VIC AB 4-0 SH 18 (SUTURE) ×2 IMPLANT
SYRINGE 20CC LL (MISCELLANEOUS) ×2 IMPLANT
SYRINGE 60CC LL (MISCELLANEOUS) ×2 IMPLANT
TOWEL OR 17X26 10 PK STRL BLUE (TOWEL DISPOSABLE) ×4 IMPLANT
TOWEL OR NON WOVEN STRL DISP B (DISPOSABLE) ×2 IMPLANT
TRAY FOLEY CATH 14FRSI W/METER (CATHETERS) ×2 IMPLANT
TROCAR ADV FIXATION 12X100MM (TROCAR) ×2 IMPLANT
TROCAR ADV FIXATION 5X100MM (TROCAR) ×2 IMPLANT
TROCAR BLADELESS 15MM (ENDOMECHANICALS) ×2 IMPLANT
TROCAR BLADELESS OPT 5 100 (ENDOMECHANICALS) ×2 IMPLANT
TUBING CONNECTING 10 (TUBING) ×2 IMPLANT
TUBING ENDO SMARTCAP (MISCELLANEOUS) ×2 IMPLANT
TUBING FILTER THERMOFLATOR (ELECTROSURGICAL) ×2 IMPLANT

## 2013-08-30 NOTE — Anesthesia Preprocedure Evaluation (Signed)
Anesthesia Evaluation  Patient identified by MRN, date of birth, ID band Patient awake    Reviewed: Allergy & Precautions, H&P , NPO status , Patient's Chart, lab work & pertinent test results  Airway Mallampati: II TM Distance: >3 FB Neck ROM: Full    Dental  (+) Teeth Intact, Dental Advisory Given   Pulmonary neg pulmonary ROS,  breath sounds clear to auscultation  Pulmonary exam normal       Cardiovascular negative cardio ROS  Rhythm:Regular Rate:Normal     Neuro/Psych negative neurological ROS  negative psych ROS   GI/Hepatic negative GI ROS, Neg liver ROS,   Endo/Other  Morbid obesity  Renal/GU negative Renal ROS  negative genitourinary   Musculoskeletal negative musculoskeletal ROS (+)   Abdominal   Peds  Hematology negative hematology ROS (+)   Anesthesia Other Findings   Reproductive/Obstetrics                           Anesthesia Physical Anesthesia Plan  ASA: III  Anesthesia Plan: General   Post-op Pain Management:    Induction: Intravenous  Airway Management Planned: Oral ETT  Additional Equipment:   Intra-op Plan:   Post-operative Plan: Extubation in OR  Informed Consent: I have reviewed the patients History and Physical, chart, labs and discussed the procedure including the risks, benefits and alternatives for the proposed anesthesia with the patient or authorized representative who has indicated his/her understanding and acceptance.   Dental advisory given  Plan Discussed with: CRNA  Anesthesia Plan Comments:         Anesthesia Quick Evaluation

## 2013-08-30 NOTE — Transfer of Care (Signed)
Immediate Anesthesia Transfer of Care Note  Patient: Courtney Mcgee  Procedure(s) Performed: Procedure(s): LAPAROSCOPIC GASTRIC SLEEVE RESECTION WITH UPPER ENDOSCOPY (N/A)  Patient Location: PACU  Anesthesia Type:General  Level of Consciousness: awake, alert , oriented and patient cooperative  Airway & Oxygen Therapy: Patient Spontanous Breathing and Patient connected to face mask oxygen  Post-op Assessment: Report given to PACU RN, Post -op Vital signs reviewed and stable and Patient moving all extremities  Post vital signs: Reviewed and stable  Complications: No apparent anesthesia complications

## 2013-08-30 NOTE — Op Note (Signed)
Name:  Courtney Mcgee MRN: [id number] Date of Surgery: 08/30/2013  Preop Diagnosis:  Morbid Obesity  Postop Diagnosis:  Morbid Obesity (Weight - 309, BMI - 41.9), S/P Gastric Sleeve  Procedure:  Upper endoscopy  (Intraoperative)  Surgeon:  Ovidio Kinavid Matisha Termine, M.D.  Anesthesia:  GET  Indications for procedure: Courtney Mcgee is a 33 y.o. female whose primary care physician is Primus BravoGIBSON,TIFFANY, NP and has completed a Gastric Sleeve today by Dr. Daphine DeutscherMartin.  I am doing an intraoperative upper endoscopy to evaluate the gastric pouch.  Operative Note: The patient is under general anesthesia.  Dr. Daphine DeutscherMartin is laparoscoping the patient while I do an upper endoscopy to evaluate the stomach pouch.  With the patient intubated, I passed the Pentax upper endoscope without difficulty down the esophagus.  The esophago-gastric junction was at 41 cm.    The mucosa of the stomach looked viable and the staple line was intact without bleeding.  I advanced to the pylorus, but did not go through it.  While I insufflated the stomach pouch with air, Dr. Daphine DeutscherMartin  flooded the upper abdomen with saline to put the gastric pouch under saline.  There was no bubbling or evidence of a leak.  Photos were taken of the gastric pouch.  There was no evidence of narrowing of the pouch and the gastric sleeve looked tubular.  The scope was then withdrawn.  The esophagus was unremarkable and the patient tolerated the endoscopy without difficulty.  Ovidio Kinavid [name], MD, [facility] Pager: [phone] Office phone:  [phone]

## 2013-08-30 NOTE — Interval H&P Note (Signed)
History and Physical Interval Note:  08/30/2013 10:40 AM  Courtney Mcgee  has presented today for surgery, with the diagnosis of Morbid Obesity  The various methods of treatment have been discussed with the patient and family. After consideration of risks, benefits and other options for treatment, the patient has consented to  Procedure(s): LAPAROSCOPIC GASTRIC SLEEVE RESECTION (N/A) as a surgical intervention .  The patient's history has been reviewed, patient examined, no change in status, stable for surgery.  I have reviewed the patient's chart and labs.  Questions were answered to the patient's satisfaction.     Tasheema Perrone B

## 2013-08-30 NOTE — H&P (View-Only) (Signed)
Chief Complaint:  Morbid obesity weight 308 BMI 42  History of Present Illness:  Courtney Mcgee is an 33 y.o. female who is ready for her upcoming sleeve gastrectomy on Monday.  She is aware of the operation and its risks and benefits.  She is a Scientist, product/process development.  She did have a gallstone on her preop ultrasound.    Past Medical History  Diagnosis Date  . Psoriasis   . Refusal of blood transfusions as patient is Jehovah's Witness     Past Surgical History  Procedure Laterality Date  . Cesarean section    . Breath tek h pylori N/A 07/16/2013    Procedure: BREATH TEK H PYLORI;  Surgeon: Valarie Merino, MD;  Location: [facility];  Service: General;  Laterality: N/A;  . Tonsillectomy    . Wisdom tooth extraction      Current Outpatient Prescriptions  Medication Sig Dispense Refill  . cetirizine (ZYRTEC) 10 MG tablet Take 10 mg by mouth daily as needed for allergies.      Marland Kitchen HYDROcodone-acetaminophen (NORCO/VICODIN) 5-325 MG per tablet Take 1 tablet by mouth every 6 (six) hours as needed (Cycle pain).      Marland Kitchen ibuprofen (ADVIL,MOTRIN) 800 MG tablet Take 800 mg by mouth every 8 (eight) hours as needed (Pain).      . SPRINTEC 28 0.25-35 MG-MCG tablet Take 1 tablet by mouth daily.       . Vitamin D, Ergocalciferol, (DRISDOL) 50000 UNITS CAPS capsule Take 50,000 Units by mouth every 7 (seven) days. Thursdays      . HYDROcodone-acetaminophen (HYCET) 7.5-325 mg/15 ml solution Take 15 mLs by mouth every 4 (four) hours as needed for moderate pain.  300 mL  0   No current facility-administered medications for this visit.   Review of patient's allergies indicates no known allergies. Family History  Problem Relation Age of Onset  . Heart disease Father   . Hypertension Other   . Hyperlipidemia Other   . Heart disease Other   . Diabetes Other    Social History:   reports that she has never smoked. She does not have any smokeless tobacco history on file. She reports that she does not drink  alcohol or use illicit drugs.   REVIEW OF SYSTEMS : Positive for gallstones ; otherwise negative  Physical Exam:   Blood pressure 126/80, pulse 74, temperature 98 F (36.7 C), height 6' (1.829 m), weight 309 lb (140.161 kg), last menstrual period 08/18/2013. Body mass index is 41.9 kg/(m^2).  Gen:  WDWN AAF NAD  Neurological: Alert and oriented to person, place, and time. Motor and sensory function is grossly intact  Head: Normocephalic and atraumatic.  Eyes: Conjunctivae are normal. Pupils are equal, round, and reactive to light. No scleral icterus.  Neck: Normal range of motion. Neck supple. No tracheal deviation or thyromegaly present.  Cardiovascular:  SR without murmurs or gallops.  No carotid bruits Breast:  Not examined Respiratory: Effort normal.  No respiratory distress. No chest wall tenderness. Breath sounds normal.  No wheezes, rales or rhonchi.  Abdomen:  obese GU:  Not examined Musculoskeletal: Normal range of motion. Extremities are nontender. No cyanosis, edema or clubbing noted Lymphadenopathy: No cervical, preauricular, postauricular or axillary adenopathy is present Skin: Skin is warm and dry. No rash noted. No diaphoresis. No erythema. No pallor. Pscyh: Normal mood and affect. Behavior is normal. Judgment and thought content normal.   LABORATORY RESULTS: No results found for this or any previous visit (from the past  48 hour(s)).   RADIOLOGY RESULTS: No results found.  Problem List: There are no active problems to display for this patient.   Assessment & Plan: Morbid obesity for sleeve gastrectomy.  Normal UGI series.      Matt B. Daphine DeutscherMartin, MD, [facility]  [facility] [phone] beeper [phone]  08/24/2013 11:24 AM

## 2013-08-30 NOTE — Anesthesia Postprocedure Evaluation (Signed)
Anesthesia Post Note  Patient: Courtney AlyKarissa L Mcgee  Procedure(s) Performed: Procedure(s) (LRB): LAPAROSCOPIC GASTRIC SLEEVE RESECTION WITH UPPER ENDOSCOPY (N/A)  Anesthesia type: General  Patient location: PACU  Post pain: Pain level controlled  Post assessment: Post-op Vital signs reviewed  Last Vitals:  Filed Vitals:   08/30/13 1432  BP: 158/96  Pulse: 66  Temp: 36.8 C  Resp: 20    Post vital signs: Reviewed  Level of consciousness: sedated  Complications: No apparent anesthesia complications

## 2013-08-30 NOTE — Op Note (Signed)
Surgeon: Wenda LowMatt Chaim Gatley, MD, FACS  Asst:  Ovidio Kinavid Newman, MD, FACS  Anes:  General endotracheal  Procedure: Laparoscopic sleeve gastrectomy  Diagnosis: Morbid obesity  Complications: none  EBL:   15 cc  Description of Procedure:  The patient was take to OR 1 and given general anesthesia.  The abdomen was prepped with PCMX and draped sterilely.  A timeout was performed.  Access to the abdomen was achieved with Optiview through the left upper quadrant.  Following insufflation, the state of the abdomen was found to be free of adhesions.  The gallbladder appeared free of adhesions.  The ViSiGi 36Fr tube was inserted to deflate the stomach and was pulled back into the esophagus.    The pylorus was identified and we measured 5 cm back and marked the antrum.  At that point we began dissection to take down the greater curvature of the stomach using the Harmonic scalpel.  This dissection was taken all the way up to the left crus.  Posterior attachments of the stomach were also taken down.    The ViSiGi tube was then passed into the antrum and suction applied so that it was snug along the lessor curvature.  The "crow's foot" or incisura was identified.  The sleeve gastrectomy was begun using the Morgan StanleyCovidien  platform stapler beginning with a 4.515mm Black load with TRS.  This was followed by 2 applications of black 6 cm TRS and then 6 cm purple loads with TRS.  When the sleeve was complete the tube was taken off suction and insufflated briefly.  The tube was withdrawn.  Upper endoscopy was then performed by Dr. Ezzard StandingNewman revealing no bubbles and no evidence of bleeding.      The specimen was extracted through the 15 trocar site.  Wounds were infiltrated with Exparel and the 15 mm trocar was closed with a single 0 vicryl and closed with 4-0 vicryl and Dermabond.    Matt B. Daphine DeutscherMartin, MD, [facility] [place] [phone]

## 2013-08-31 ENCOUNTER — Inpatient Hospital Stay (HOSPITAL_COMMUNITY): Payer: BC Managed Care – PPO

## 2013-08-31 ENCOUNTER — Encounter (HOSPITAL_COMMUNITY): Payer: Self-pay | Admitting: Surgery

## 2013-08-31 LAB — CBC WITH DIFFERENTIAL/PLATELET
BASOS ABS: 0 10*3/uL (ref 0.0–0.1)
Basophils Relative: 0 % (ref 0–1)
EOS PCT: 0 % (ref 0–5)
Eosinophils Absolute: 0 10*3/uL (ref 0.0–0.7)
HCT: 34.3 % — ABNORMAL LOW (ref 36.0–46.0)
HEMOGLOBIN: 10.9 g/dL — AB (ref 12.0–15.0)
Lymphocytes Relative: 15 % (ref 12–46)
Lymphs Abs: 2.1 10*3/uL (ref 0.7–4.0)
MCH: 27.5 pg (ref 26.0–34.0)
MCHC: 31.8 g/dL (ref 30.0–36.0)
MCV: 86.6 fL (ref 78.0–100.0)
MONO ABS: 0.7 10*3/uL (ref 0.1–1.0)
Monocytes Relative: 5 % (ref 3–12)
Neutro Abs: 11.2 10*3/uL — ABNORMAL HIGH (ref 1.7–7.7)
Neutrophils Relative %: 80 % — ABNORMAL HIGH (ref 43–77)
Platelets: 181 10*3/uL (ref 150–400)
RBC: 3.96 MIL/uL (ref 3.87–5.11)
RDW: 14.1 % (ref 11.5–15.5)
WBC: 14 10*3/uL — AB (ref 4.0–10.5)

## 2013-08-31 MED ORDER — DIPHENHYDRAMINE HCL 50 MG/ML IJ SOLN
25.0000 mg | Freq: Four times a day (QID) | INTRAMUSCULAR | Status: DC | PRN
  Administered 2013-08-31 (×2): 25 mg via INTRAVENOUS
  Filled 2013-08-31 (×2): qty 1

## 2013-08-31 MED ORDER — IOHEXOL 300 MG/ML  SOLN
50.0000 mL | Freq: Once | INTRAMUSCULAR | Status: AC | PRN
  Administered 2013-08-31: 50 mL via ORAL

## 2013-08-31 NOTE — Care Management Note (Signed)
    Page 1 of 1   08/31/2013     4:22:57 PM CARE MANAGEMENT NOTE 08/31/2013  Patient:  Courtney Mcgee,Courtney Mcgee   Account Number:  [id number]  Date Initiated:  08/31/2013  Documentation initiated by:  Lanier ClamMAHABIR,Renton Berkley  Subjective/Objective Assessment:   33 Y/O F ADMITTED W/GASTRIC SLEEVE.     Action/Plan:   FROM HOME.   Anticipated DC Date:  09/01/2013   Anticipated DC Plan:  HOME/SELF CARE      DC Planning Services  CM consult      Choice offered to / List presented to:             Status of service:  In process, will continue to follow Medicare Important Message given?   (If response is "NO", the following Medicare IM given date fields will be blank) Date Medicare IM given:   Medicare IM given by:   Date Additional Medicare IM given:   Additional Medicare IM given by:    Discharge Disposition:    Per UR Regulation:  Reviewed for med. necessity/level of care/duration of stay  If discussed at Long Length of Stay Meetings, dates discussed:    Comments:  08/31/13 Traeger Sultana RN,BSN NCM [phone] NO ANTICIPATED D/C NEEDS.

## 2013-08-31 NOTE — Progress Notes (Signed)
Nutrition Education Note  Received consult for diet education per DROP protocol.   Discussed 2 week post op diet with pt. Emphasized that liquids must be non carbonated, non caffeinated, and sugar free. Fluid goals discussed. Pt to follow up with outpatient bariatric RD for further diet progression after 2 weeks. Multivitamins and minerals also reviewed. Teach back method used, pt expressed understanding, expect good compliance. Had multiple questions about vitamins and minerals which were answered.    Diet: First 2 Weeks  You will see the nutritionist about two (2) weeks after your surgery. The nutritionist will increase the types of foods you can eat if you are handling liquids well:  If you have severe vomiting or nausea and cannot handle clear liquids lasting longer than 1 day, call your surgeon  Protein Shake  Drink at least 2 ounces of shake 5-6 times per day  Each serving of protein shakes (usually 8 - 12 ounces) should have a minimum of:  15 grams of protein  And no more than 5 grams of carbohydrate  Goal for protein each day:  Men = 80 grams per day  Women = 60 grams per day  Protein powder may be added to fluids such as non-fat milk or Lactaid milk or Soy milk (limit to 35 grams added protein powder per serving)   Hydration  Slowly increase the amount of water and other clear liquids as tolerated (See Acceptable Fluids)  Slowly increase the amount of protein shake as tolerated  Sip fluids slowly and throughout the day  May use sugar substitutes in small amounts (no more than 6 - 8 packets per day; i.e. Splenda)   Fluid Goal  The first goal is to drink at least 8 ounces of protein shake/drink per day (or as directed by the nutritionist); some examples of protein shakes are ITT IndustriesSyntrax Nectar, Dillard'sdkins Advantage, EAS Edge HP, and Unjury. See handout from pre-op Bariatric Education Class:  Slowly increase the amount of protein shake you drink as tolerated  You may find it easier to  slowly sip shakes throughout the day  It is important to get your proteins in first  Your fluid goal is to drink 64 - 100 ounces of fluid daily  It may take a few weeks to build up to this  32 oz (or more) should be clear liquids  And  32 oz (or more) should be full liquids (see below for examples)  Liquids should not contain sugar, caffeine, or carbonation   Clear Liquids:  Water or Sugar-free flavored water (i.e. Fruit H2O, Propel)  Decaffeinated coffee or tea (sugar-free)  Crystal Lite, Wyler's Lite, Minute Maid Lite  Sugar-free Jell-O  Bouillon or broth  Sugar-free Popsicle: *Less than 20 calories each; Limit 1 per day   Full Liquids:  Protein Shakes/Drinks + 2 choices per day of other full liquids  Full liquids must be:  No More Than 12 grams of Carbs per serving  No More Than 3 grams of Fat per serving  Strained low-fat cream soup  Non-Fat milk  Fat-free Lactaid Milk  Sugar-free yogurt (Dannon Lite & Fit, Greek yogurt)     ChumucklaHeather Francies Inch MS, RD, [place]LDN [phone] Pager [phone] Weekend/After Hours Pager

## 2013-08-31 NOTE — Progress Notes (Signed)
Patient alert and oriented, Post op day 1.  Provided support and encouragement.  Encouraged pulmonary toilet, ambulation and small sips of liquids when swallow study returned satisfactorily.  All questions answered.  Will continue to monitor. 

## 2013-08-31 NOTE — Progress Notes (Signed)
Patient ID: Courtney Mcgee, female   DOB: 1980/06/22, 33 y.o.   MRN: [id number] [facility] Progress Note:   1 Day Post-Op  Subjective: Mental status is clear. Objective: Vital signs in last 24 hours: Temp:  [97.7 F (36.5 C)-99.1 F (37.3 C)] 98.4 F (36.9 C) (07/21 0915) Pulse Rate:  [66-93] 69 (07/21 0915) Resp:  [17-20] 18 (07/21 0915) BP: (113-162)/(63-98) 113/75 mmHg (07/21 0915) SpO2:  [95 %-100 %] 98 % (07/21 0915)  Intake/Output from previous day: 07/20 0701 - 07/21 0700 In: 3433.3 [I.V.:3433.3] Out: 4600 [Urine:4550; Blood:50] Intake/Output this shift: Total I/O In: 325 [I.V.:325] Out: 250 [Urine:250]  Physical Exam: Work of breathing is normal.  No pain complaints  Lab Results:  Results for orders placed during the hospital encounter of 08/30/13 (from the past 48 hour(s))  HEMOGLOBIN AND HEMATOCRIT, BLOOD     Status: Abnormal   Collection Time    08/30/13  2:03 PM      Result Value Ref Range   Hemoglobin 11.8 (*) 12.0 - 15.0 g/dL   HCT 36.2  36.0 - 46.0 %  CREATININE, SERUM     Status: None   Collection Time    08/30/13  3:23 PM      Result Value Ref Range   Creatinine, Ser 0.75  0.50 - 1.10 mg/dL   GFR calc non Af Amer >90  >90 mL/min   GFR calc Af Amer >90  >90 mL/min   Comment: (NOTE)     The eGFR has been calculated using the CKD EPI equation.     This calculation has not been validated in all clinical situations.     eGFR's persistently <90 mL/min signify possible Chronic Kidney     Disease.  CBC WITH DIFFERENTIAL     Status: Abnormal   Collection Time    08/31/13  4:55 AM      Result Value Ref Range   WBC 14.0 (*) 4.0 - 10.5 K/uL   RBC 3.96  3.87 - 5.11 MIL/uL   Hemoglobin 10.9 (*) 12.0 - 15.0 g/dL   HCT 34.3 (*) 36.0 - 46.0 %   MCV 86.6  78.0 - 100.0 fL   MCH 27.5  26.0 - 34.0 pg   MCHC 31.8  30.0 - 36.0 g/dL   RDW 14.1  11.5 - 15.5 %   Platelets 181  150 - 400 K/uL   Neutrophils Relative % 80 (*) 43 - 77 %   Neutro Abs 11.2  (*) 1.7 - 7.7 K/uL   Lymphocytes Relative 15  12 - 46 %   Lymphs Abs 2.1  0.7 - 4.0 K/uL   Monocytes Relative 5  3 - 12 %   Monocytes Absolute 0.7  0.1 - 1.0 K/uL   Eosinophils Relative 0  0 - 5 %   Eosinophils Absolute 0.0  0.0 - 0.7 K/uL   Basophils Relative 0  0 - 1 %   Basophils Absolute 0.0  0.0 - 0.1 K/uL    Radiology/Results: Dg Ugi W/water Sol Cm  08/31/2013   CLINICAL DATA:  postop gastric sleeve.  EXAM: WATER SOLUBLE UPPER GI SERIES  TECHNIQUE: Single-column upper GI series was performed using water soluble contrast.  CONTRAST:  46m OMNIPAQUE IOHEXOL 300 MG/ML  SOLN  COMPARISON:  07/14/2013  FLUOROSCOPY TIME:  50 seconds  FINDINGS: Patient ingested water-soluble contrast material which easily passed from the esophagus into the proximal stomach. No extravasation of contrast material identified to suggest leakage. Postoperative appearance of the  stomach is compatible with gastric sleeve. Expected emptying into the proximal small bowel loops noted.  IMPRESSION: 1. No complications status post gastric sleeve procedure. No extravasation of contrast suggest leak.   Electronically Signed   By: Kerby Moors M.D.   On: 08/31/2013 10:44    Anti-infectives: Anti-infectives   Start     Dose/Rate Route Frequency Ordered Stop   08/30/13 0745  cefOXitin (MEFOXIN) 2 g in dextrose 5 % 50 mL IVPB     2 g 100 mL/hr over 30 Minutes Intravenous On call to O.R. 08/30/13 0745 08/30/13 1058      Assessment/Plan: Problem List: Patient Active Problem List   Diagnosis Date Noted  . 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 S/P laparoscopic appendectomy 08/30/2013  . Gallstone 08/24/2013  . Morbid obesity 08/24/2013    UGI OK.  Begin PD 1 diet.  1 Day Post-Op    LOS: 1 day   Matt B. Hassell Done, MD, [facility] [phone] beeper [phone]  08/31/2013 11:40 AM

## 2013-09-01 LAB — CBC WITH DIFFERENTIAL/PLATELET
BASOS ABS: 0 10*3/uL (ref 0.0–0.1)
Basophils Relative: 0 % (ref 0–1)
EOS ABS: 0.1 10*3/uL (ref 0.0–0.7)
EOS PCT: 1 % (ref 0–5)
HCT: 32.9 % — ABNORMAL LOW (ref 36.0–46.0)
HEMOGLOBIN: 10.3 g/dL — AB (ref 12.0–15.0)
Lymphocytes Relative: 41 % (ref 12–46)
Lymphs Abs: 5 10*3/uL — ABNORMAL HIGH (ref 0.7–4.0)
MCH: 27.5 pg (ref 26.0–34.0)
MCHC: 31.3 g/dL (ref 30.0–36.0)
MCV: 88 fL (ref 78.0–100.0)
MONO ABS: 0.7 10*3/uL (ref 0.1–1.0)
MONOS PCT: 6 % (ref 3–12)
Neutro Abs: 6.3 10*3/uL (ref 1.7–7.7)
Neutrophils Relative %: 52 % (ref 43–77)
Platelets: 160 10*3/uL (ref 150–400)
RBC: 3.74 MIL/uL — ABNORMAL LOW (ref 3.87–5.11)
RDW: 14.3 % (ref 11.5–15.5)
WBC: 12 10*3/uL — ABNORMAL HIGH (ref 4.0–10.5)

## 2013-09-01 NOTE — Discharge Summary (Signed)
Physician Discharge Summary  Patient ID: Laurence AlyKarissa L Serpe MRN: [id number] DOB/AGE: [birth year] 33 y.o.  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  PD UGI looked ok.  Begun on liquids and advanced and ready for discharge on PD 2.  Consults: none  Significant Diagnostic Studies: UGI    Discharge Exam: Blood pressure 108/59, pulse 78, temperature 99.1 F (37.3 C), temperature source Oral, resp. rate 18, height 6' (1.829 m), weight 308 lb 2 oz (139.765 kg), last menstrual period 08/18/2013, SpO2 98.00%. Incisions ok  Disposition: 01-Home or Self Care  Discharge Instructions   Discharge instructions    Complete by:  As directed   Follow bariatric dietary guidelines     Increase activity slowly    Complete by:  As directed      No wound care    Complete by:  As directed             Medication List         cetirizine 10 MG tablet  Commonly known as:  ZYRTEC  Take 10 mg by mouth daily as needed for allergies.     HYDROcodone-acetaminophen 5-325 MG per tablet  Commonly known as:  NORCO/VICODIN  Take 1 tablet by mouth every 6 (six) hours as needed (Cycle pain).     HYDROcodone-acetaminophen 7.5-325 mg/15 ml solution  Commonly known as:  HYCET  Take 15 mLs by mouth every 4 (four) hours as needed for moderate pain.     ibuprofen 800 MG tablet  Commonly known as:  ADVIL,MOTRIN  Take 800 mg by mouth every 8 (eight) hours as needed (Pain).     SPRINTEC 28 0.25-35 MG-MCG tablet  Generic drug:  norgestimate-ethinyl estradiol  Take 1 tablet by mouth daily.     Vitamin D (Ergocalciferol) 50000 UNITS Caps capsule  Commonly known as:  DRISDOL  Take 50,000 Units by mouth every 7 (seven) days. Thursdays           Follow-up Information    Follow up with Valarie MerinoMARTIN,Kindrick Lankford B, MD.   Specialty:  General Surgery   Contact information:   [address] [place] [address] [phone]       Signed: Valarie MerinoMARTIN,Maquita Sandoval B 09/01/2013, 1:37 PM

## 2013-09-01 NOTE — Discharge Instructions (Signed)

## 2013-09-01 NOTE — Progress Notes (Signed)
Patient alert and oriented, pain is controlled. Patient is tolerating fluids, plan to advance to protein shake today.  Reviewed Gastric sleeve discharge instructions with patient and patient is able to articulate understanding.  Provided information on BELT program, Support Group and [facility] outpatient pharmacy. All questions answered, will continue to monitor.  

## 2013-09-07 ENCOUNTER — Telehealth: Payer: Self-pay | Admitting: Dietician

## 2013-09-07 NOTE — Telephone Encounter (Signed)
The patient is 8 days post op Gastric Sleeve. [facility]. She states that she is tolerating protein shakes and strained cream soups. However, she is having some pain with yogurt and jello. No nausea, vomiting, or diarrhea. She asked about adding solid foods. I recommended that she wait until 10 days post op and slowly add eggs, deli meat, cheese, and other very soft, moistened meats. I reminded her to chew thoroughly and take small bites. The patient verbalized understanding.

## 2013-09-14 ENCOUNTER — Encounter: Payer: BC Managed Care – PPO | Attending: Surgery

## 2013-09-14 ENCOUNTER — Telehealth (HOSPITAL_COMMUNITY): Payer: Self-pay

## 2013-09-14 DIAGNOSIS — Z713 Dietary counseling and surveillance: Secondary | ICD-10-CM | POA: Insufficient documentation

## 2013-09-14 DIAGNOSIS — Z01818 Encounter for other preprocedural examination: Secondary | ICD-10-CM | POA: Diagnosis not present

## 2013-09-14 NOTE — Telephone Encounter (Signed)
Late entry  Made discharge phone call to patient per DROP protocol. Asking the following questions.    1. Do you have someone to care for you now that you are home?  y 2. Are you having pain now that is not relieved by your pain medication?  n 3. Are you able to drink the recommended daily amount of fluids (48 ounces minimum/day) and protein (60-80 grams/day) as prescribed by the dietitian or nutritional counselor?  y 4. Are you taking the vitamins and minerals as prescribed?  y 5. Do you have the "on call" number to contact your surgeon if you have a problem or question?  y 6. Are your incisions free of redness, swelling or drainage? (If steri strips, address that these can fall off, shower as tolerated) y 7. Have your bowels moved since your surgery?  If not, are you passing gas?  N, y  8. Are you up and walking 3-4 times per day?  y    1. Do you have an appointment made to see your surgeon in the next month?  y 2. Were you provided your discharge medications before your surgery or before you were discharged from the hospital and are you taking them without problem?  y 3. Were you provided phone numbers to the clinic/surgeon's office?  y 4. Did you watch the patient education video module in the (clinic, surgeon's office, etc.) before your surgery? y 5. Do you have a discharge checklist that was provided to you in the hospital to reference with instructions on how to take care of yourself after surgery?  y 6. Did you see a dietitian or nutritional counselor while you were in the hospital?  y 7. Do you have an appointment to see a dietitian or nutritional counselor in the next month?  y

## 2013-09-14 NOTE — Progress Notes (Signed)
Bariatric Class:  Appt start time: 1530 end time:  1630.  2 Week Post-Operative Nutrition Class  Patient was seen on 09/14/2013 for Post-Operative Nutrition [facility].   Surgery date: 08/30/13 Surgery type: gastric sleeve [facility] weight at [facility]: 316 lbs on 07/08/13 Weight today: 291.5 lbs  Weight change: 18 lbs  TANITA  BODY COMP RESULTS  08/23/13 09/14/13   BMI (kg/m^2) 42 39.5   Fat Mass (lbs) 159 148.0   Fat Free Mass (lbs) 150.5 143.5   Total Body Water (lbs) 110 105.0    The following the learning objectives were met by the patient during this course:  Identifies Phase 3A (Soft, High Proteins) Dietary Goals and will begin from 2 weeks post-operatively to 2 months post-operatively  Identifies appropriate sources of fluids and proteins   States protein recommendations and appropriate sources post-operatively  Identifies the need for appropriate texture modifications, mastication, and bite sizes when consuming solids  Identifies appropriate multivitamin and calcium sources post-operatively  Describes the need for physical activity post-operatively and will follow MD recommendations  States when to call healthcare provider regarding medication questions or post-operative complications  Handouts given during class include:  Phase 3A: Soft, High Protein Diet Handout  Follow-Up Plan: Patient will follow-up at [facility] in 6 weeks for 2 month post-op nutrition visit for diet advancement per MD.

## 2013-09-14 NOTE — Patient Instructions (Signed)
Patient to follow Phase 3A-Soft, High Protein Diet and follow-up at [facility] in 6 weeks for 2 months post-op nutrition visit for diet advancement. 

## 2013-09-21 ENCOUNTER — Telehealth (INDEPENDENT_AMBULATORY_CARE_PROVIDER_SITE_OTHER): Payer: Self-pay

## 2013-09-21 NOTE — Telephone Encounter (Signed)
Pt called stating she has noticed discomfort at top of foot and just above ankle on left foot. Sore to touch. No swelling. No fever. No change in color. No hx of injury. Pt will see her PCP today to have area evaluated. Pt advised she should have MD eval and call with any concerns or recommendations. Pt states she understands.

## 2013-09-23 ENCOUNTER — Encounter (INDEPENDENT_AMBULATORY_CARE_PROVIDER_SITE_OTHER): Payer: Self-pay

## 2013-09-29 ENCOUNTER — Ambulatory Visit (INDEPENDENT_AMBULATORY_CARE_PROVIDER_SITE_OTHER): Payer: BC Managed Care – PPO | Admitting: Surgery

## 2013-09-29 ENCOUNTER — Encounter (INDEPENDENT_AMBULATORY_CARE_PROVIDER_SITE_OTHER): Payer: Self-pay | Admitting: Surgery

## 2013-09-29 NOTE — Progress Notes (Signed)
Courtney AlyKarissa L Mcgee 33 y.o.  Body mass index is 38.64 kg/(m^2).  Patient Active Problem List   Diagnosis Date Noted  . 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 Gallstone 08/24/2013  . Morbid obesity 08/24/2013    No Known Allergies    Past Surgical History  Procedure Laterality Date  . Cesarean section    . Breath tek h pylori N/A 07/16/2013    Procedure: BREATH TEK H PYLORI;  Surgeon: Valarie MerinoMatthew B Worthy Boschert, MD;  [facility];  Service: General;  Laterality: N/A;  . Tonsillectomy    . Wisdom tooth extraction    . Laparoscopic gastric sleeve resection N/A 08/30/2013    Procedure: LAPAROSCOPIC GASTRIC SLEEVE RESECTION WITH UPPER ENDOSCOPY;  Surgeon: Valarie MerinoMatthew B Slayde Brault, MD;  Location: [facility];  Service: General;  Laterality: N/APrimus Bravo;   GIBSON,TIFFANY, NP No diagnosis found.  Doing very well.  Didcussed gallstone-no adhesions.  Will see back in 6 weeks.   Matt B. Daphine DeutscherMartin, MD, [facility]  [facility] [phone] beeper [phone]  09/29/2013 5:34 PM

## 2013-10-25 ENCOUNTER — Encounter: Payer: BC Managed Care – PPO | Attending: Surgery | Admitting: Dietician

## 2013-10-25 DIAGNOSIS — Z713 Dietary counseling and surveillance: Secondary | ICD-10-CM | POA: Insufficient documentation

## 2013-10-25 DIAGNOSIS — Z01818 Encounter for other preprocedural examination: Secondary | ICD-10-CM | POA: Diagnosis not present

## 2013-10-25 NOTE — Progress Notes (Signed)
  Follow-up visit:  8 Weeks Post-Operative Gastric Sleeve Surgery  Medical Nutrition Therapy:  Appt start time: 310 end time:  340  Primary concerns today: Post-operative Bariatric Surgery Nutrition Management. Nakira returns today having lost a total of 42 lbs. She is tolerating all recommended foods and is meeting her protein needs. She tries to work out 5 days a week. Kyana reports that she gets very hungry between meals. She has also been having some fruit.   Surgery date: 08/30/13 Surgery type: gastric sleeve [facility] weight at [facility]: 316 lbs on 07/08/13 Weight today: 274 lbs Weight change: 17.5 lbs Total weight lost: 42 lbs  TANITA  BODY COMP RESULTS  08/23/13 09/14/13 10/25/13   BMI (kg/m^2) 42 39.5 37.2   Fat Mass (lbs) 159 148.0 132.5   Fat Free Mass (lbs) 150.5 143.5 141.5   Total Body Water (lbs) 110 105.0 103.5    Preferred Learning Style:   No preference indicated   Learning Readiness:   Ready  24-hr recall: B (AM): premier protein shake (30g)  Snk (AM): 1 egg with cheese and 1 pc bacon (8g) and maybe another shake L (PM): 1-2 oz chicken with a vegetable (7-14g) Snk (PM): sometimes 1/2 banana or grapes  D (PM): 1-2 oz meat with vegetable (7-14g) Snk ( PM): 1-2 oz cheese and grapes OR 1/2 protein shake (6-15g)  Fluid intake: 20-40 oz water and 22 oz protein shake Estimated total protein intake: 60+ grams per day  Medications: oral contraception only Supplementation: taking  Using straws: usually not Drinking while eating: no Hair loss: no Carbonated beverages: none N/V/D/C: none Dumping syndrome: none  Recent physical activity:  Boot camp, walking at lunch, body pump class (5 days a week)   Progress Towards Goal(s):  In progress.  Handouts given during visit include:  Bariatric Fast Food Guide  Phase 3B lean protein + non-starchy vegetables   Nutritional Diagnosis:  [place]-3.3 Overweight/obesity related to past poor dietary habits and physical inactivity  as evidenced by patient w/ recent gastric sleeve surgery following dietary guidelines for continued weight loss.     Intervention:  Nutrition counseling provided.  Teaching Method Utilized:  Visual Auditory Hands on  Barriers to learning/adherence to lifestyle change: none  Demonstrated degree of understanding via:  Teach Back   Monitoring/Evaluation:  Dietary intake, exercise, and body weight. Follow up in 1.5 months for 3.5 month post-op visit.

## 2013-10-25 NOTE — Patient Instructions (Addendum)
Goals:  Follow Phase 3B: High Protein + Non-Starchy Vegetables  Eat 3-6 small meals/snacks, every 3-5 hrs  Increase lean protein foods to meet 60g goal  Increase fluid intake to 64oz +  Avoid drinking 15 minutes before, during and 30 minutes after eating  Aim for >30 min of physical activity daily  Limit carbs to 15 grams per meal (if you see weight loss slow/stop, decrease carbs)  Snacks between meals: non-starchy vegetables, Dannon Light and Fit greek yogurt, Quest bars, P3 packs, [place] sausage  Limit protein shakes to 1-2 a day     TANITA  BODY COMP RESULTS  08/23/13 09/14/13 10/25/13   BMI (kg/m^2) 42 39.5 37.2   Fat Mass (lbs) 159 148.0 132.5   Fat Free Mass (lbs) 150.5 143.5 141.5   Total Body Water (lbs) 110 105.0 103.5

## 2013-11-12 ENCOUNTER — Ambulatory Visit (INDEPENDENT_AMBULATORY_CARE_PROVIDER_SITE_OTHER): Payer: BC Managed Care – PPO | Admitting: Surgery

## 2013-12-06 ENCOUNTER — Encounter: Payer: BC Managed Care – PPO | Attending: Surgery | Admitting: Dietician

## 2013-12-06 DIAGNOSIS — Z713 Dietary counseling and surveillance: Secondary | ICD-10-CM | POA: Insufficient documentation

## 2013-12-06 DIAGNOSIS — Z6835 Body mass index (BMI) 35.0-35.9, adult: Secondary | ICD-10-CM | POA: Diagnosis not present

## 2013-12-06 NOTE — Patient Instructions (Addendum)
-  Try sipping clear liquids throughout the day  -Broth, Powerade Zero, G2 gatorade, sugar free popsicles, hot tea  -Try a clear protein shake (Isopure)  -Try only liquids for a day  TANITA  BODY COMP RESULTS  08/23/13 09/14/13 10/25/13 12/06/13   BMI (kg/m^2) 42 39.5 37.2 35.6   Fat Mass (lbs) 159 148.0 132.5 129.5   Fat Free Mass (lbs) 150.5 143.5 141.5 133   Total Body Water (lbs) 110 105.0 103.5 97.5

## 2013-12-06 NOTE — Progress Notes (Signed)
  Follow-up visit:  3 months Post-Operative Gastric Sleeve Surgery  Medical Nutrition Therapy:  Appt start time: 230 end time:  305  Primary concerns today: Post-operative Bariatric Surgery Nutrition Management. Courtney Mcgee returns today stating that she has been vomiting when she eats for the past 4 days. She is only able to tolerate very small bites of food. Also vomiting with protein shakes occasionally. However, she does not feel like this is related to illness. She has been drinking Gatorade. Has tried some pasta, bread, alcohol, and potatoes but can only eat a bite.   Surgery date: 08/30/13 Surgery type: gastric sleeve [facility] lbs on 07/08/13 (319 lbs per patient) Weight today: 262 lbs Weight change: 12 lbs Total weight lost: 57 lbs  TANITA  BODY COMP RESULTS  08/23/13 09/14/13 10/25/13 12/06/13   BMI (kg/m^2) 42 39.5 37.2 35.6   Fat Mass (lbs) 159 148.0 132.5 129.5   Fat Free Mass (lbs) 150.5 143.5 141.5 133   Total Body Water (lbs) 110 105.0 103.5 97.5    Preferred Learning Style:   No preference indicated   Learning Readiness:   Ready  24-hr recall: B (AM): premier protein shake (30g)  Snk (AM): 1 egg and 1 piece of bacon (10g) L (PM): 1-2 oz chicken with a vegetable (7-14g) Snk (PM): P3 protein packs  D (PM): 1-2 oz meat with vegetable (7-14g) Snk ( PM):   Fluid intake: 20-40 oz water and 22 oz protein shake; has had Gatorade recently Estimated total protein intake: 60+ grams per day  Medications: oral contraception only Supplementation: has not taken any for the past few days  Using straws: usually not Drinking while eating: no Hair loss: no Carbonated beverages: none N/V/D/C: vomiting after eating; constipation resolved with caffeine Dumping syndrome: none  Recent physical activity:  Boot camp Mondays and Wednesdays, walking at lunch, body pump class on Fridays   Progress Towards Goal(s):  In progress.  Handouts given during visit  include:  Pregnancy in the bariatric patient (The patient reports plans to conceive 1 year post op)   Nutritional Diagnosis:  [place]-3.3 Overweight/obesity related to past poor dietary habits and physical inactivity as evidenced by patient w/ recent gastric sleeve surgery following dietary guidelines for continued weight loss.     Intervention:  Nutrition counseling provided.  Teaching Method Utilized:  Visual Auditory  Barriers to learning/adherence to lifestyle change: none  Demonstrated degree of understanding via:  Teach Back   Monitoring/Evaluation:  Dietary intake, exercise, and body weight. Follow up in 3 months for 6 month post-op visit.

## 2014-02-07 ENCOUNTER — Encounter: Payer: BC Managed Care – PPO | Attending: Surgery | Admitting: Dietician

## 2014-02-07 DIAGNOSIS — Z6835 Body mass index (BMI) 35.0-35.9, adult: Secondary | ICD-10-CM | POA: Insufficient documentation

## 2014-02-07 DIAGNOSIS — Z713 Dietary counseling and surveillance: Secondary | ICD-10-CM | POA: Diagnosis not present

## 2014-02-07 NOTE — Progress Notes (Signed)
  Follow-up visit:  5 months Post-Operative Gastric Sleeve Surgery  Medical Nutrition Therapy:  Appt start time: 335 end time: 405  Primary concerns today: Post-operative Bariatric Surgery Nutrition Management. [name] returns today having lost another 5 pounds in the last 3 months. She has recently been under a lot of stress as she got laid off from her job, her car broke down, and she had surgery on her toes. She reports she can eat more at one time and eating more carbs and sometimes regular soda. Craving sweets.  Surgery date: 08/30/13 Surgery type: gastric sleeve [facility] lbs on 07/08/13 (319 lbs per patient) Weight today: 256.5 lbs Weight change: 5.5 lbs Total weight lost: 62.5 lbs  TANITA  BODY COMP RESULTS  08/23/13 09/14/13 10/25/13 12/06/13 02/07/14   BMI (kg/m^2) 42 39.5 37.2 35.6 34.8   Fat Mass (lbs) 159 148.0 132.5 129.5 124   Fat Free Mass (lbs) 150.5 143.5 141.5 133 132.5   Total Body Water (lbs) 110 105.0 103.5 97.5 97    Preferred Learning Style:   No preference indicated   Learning Readiness:   Ready  24-hr recall: B (AM): oatmeal with butter or brown sugar or protein shake (0-30g) Snk (AM):  L (PM): salad OR 1-2 oz chicken with a vegetable (7-14g) Snk (PM): banana or pistachios (0-6g) D (PM): 1-2 oz meat with vegetable (7-14g) Snk ( PM): ice cream sometimes  Fluid intake: regular soda, sweet tea, water Estimated total protein intake: 60+ grams per day  Medications: oral contraception only Supplementation: taking; having trouble being consistent  Using straws: usually not Drinking while eating: no Hair loss: no Carbonated beverages: yes N/V/D/C: none Dumping syndrome: none  Recent physical activity:  Boot camp Mondays and Wednesdays, walking at lunch, body pump class on Fridays   Progress Towards Goal(s):  In progress.  Handouts given during visit include:  Bariatric Fast Food Guide  Bariatric Snack List   Nutritional  Diagnosis:  [place]-3.3 Overweight/obesity related to past poor dietary habits and physical inactivity as evidenced by patient w/ recent gastric sleeve surgery following dietary guidelines for continued weight loss.     Intervention:  Nutrition counseling provided.  Teaching Method Utilized:  Visual Auditory  Barriers to learning/adherence to lifestyle change: none  Demonstrated degree of understanding via:  Teach Back   Monitoring/Evaluation:  Dietary intake, exercise, and body weight. Follow up in 3 months for 8 month post-op visit.

## 2014-02-07 NOTE — Patient Instructions (Addendum)
-  Try keeping only healthy, high-protein foods in the home -Avoid keeping trigger foods in the house -Continue exercise routine! -Try to keep overall routine; pre plan meals -Keep sugar free popsicles or sugar free jello on hand for sweet cravings -Keep yummy, sugar free drinks in the house (sugar free ICE, crystal light, G2 Gatorade) -Limit carbs (oatmeal, bananas, sweets) -Choose high-protein foods -Breakfast: [place] sausage, yogurt -Start setting alarms again for supplements  TANITA  BODY COMP RESULTS  08/23/13 09/14/13 10/25/13 12/06/13 02/07/14   BMI (kg/m^2) 42 39.5 37.2 35.6 34.8   Fat Mass (lbs) 159 148.0 132.5 129.5 124   Fat Free Mass (lbs) 150.5 143.5 141.5 133 132.5   Total Body Water (lbs) 110 105.0 103.5 97.5 97

## 2014-05-09 ENCOUNTER — Ambulatory Visit: Payer: BC Managed Care – PPO | Admitting: Dietician

## 2014-07-06 ENCOUNTER — Ambulatory Visit: Payer: Self-pay | Admitting: Dietician

## 2015-12-21 ENCOUNTER — Encounter (HOSPITAL_COMMUNITY): Payer: Self-pay

## 2016-04-01 IMAGING — US US ABDOMEN COMPLETE
1 series · 13 of 25 positions shown · non-contrast
Comparison: None.

CLINICAL DATA: Morbid obesity.

EXAM:
ULTRASOUND ABDOMEN COMPLETE

[Series 1: us abdomen complete · 13 of 126 slices shown]
[im 1/126]
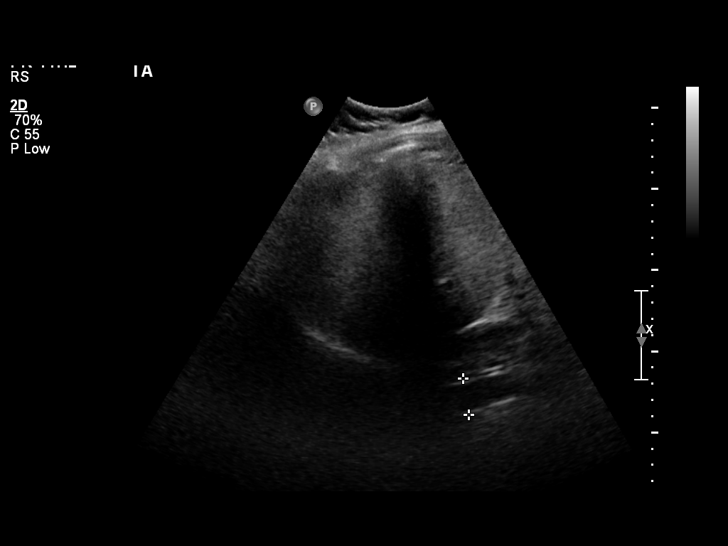
[im 11/126]
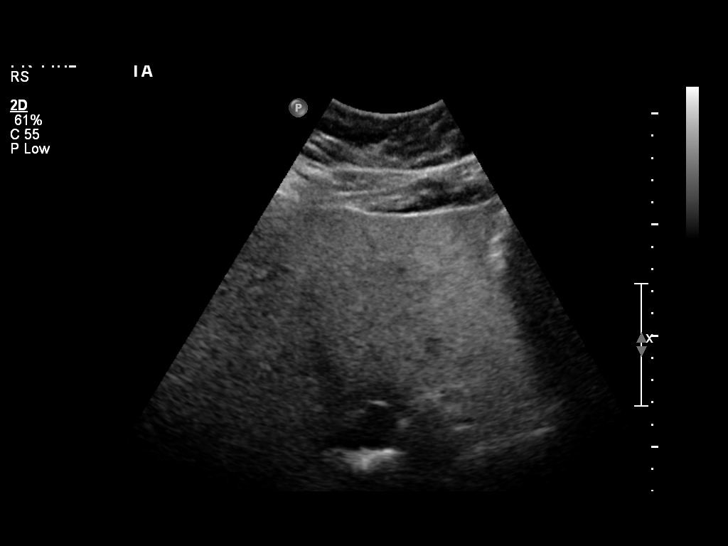
[im 21/126]
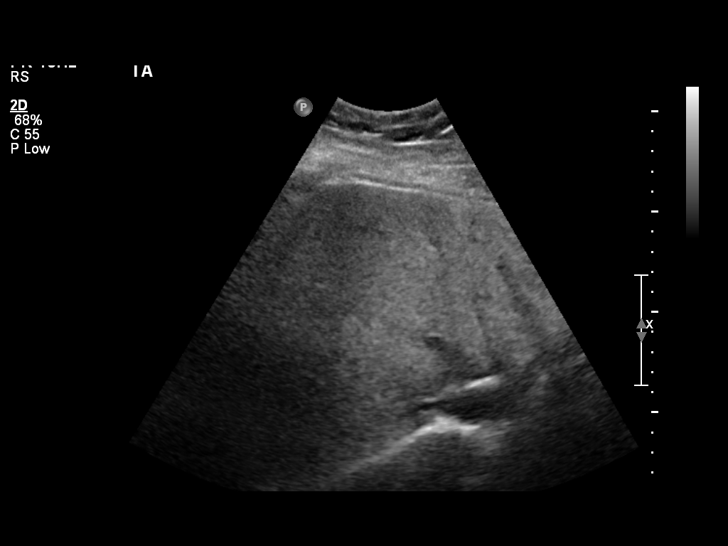
[im 32/126]
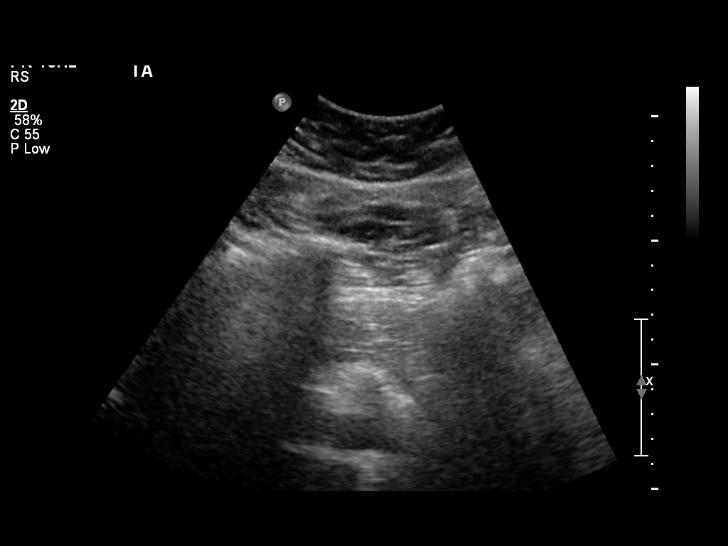
[im 42/126]
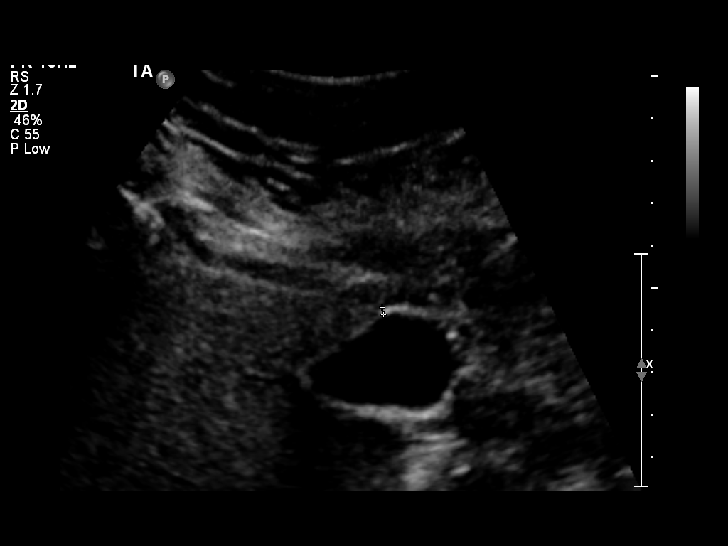
[im 53/126]
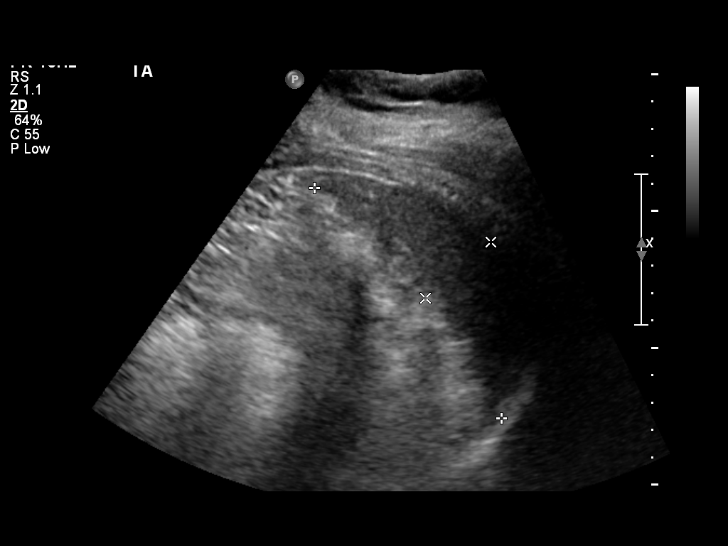
[im 63/126]
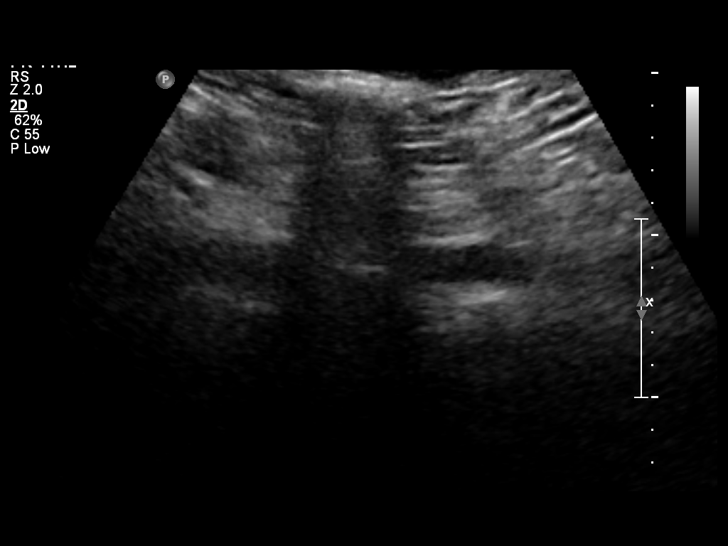
[im 73/126]
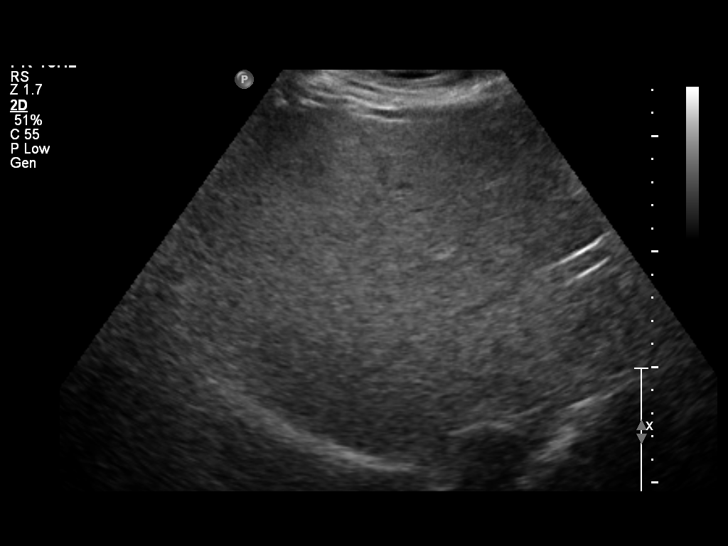
[im 84/126]
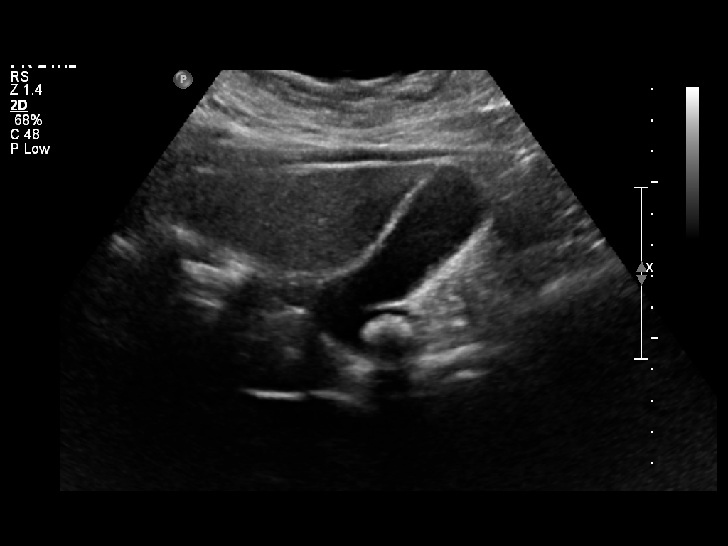
[im 94/126]
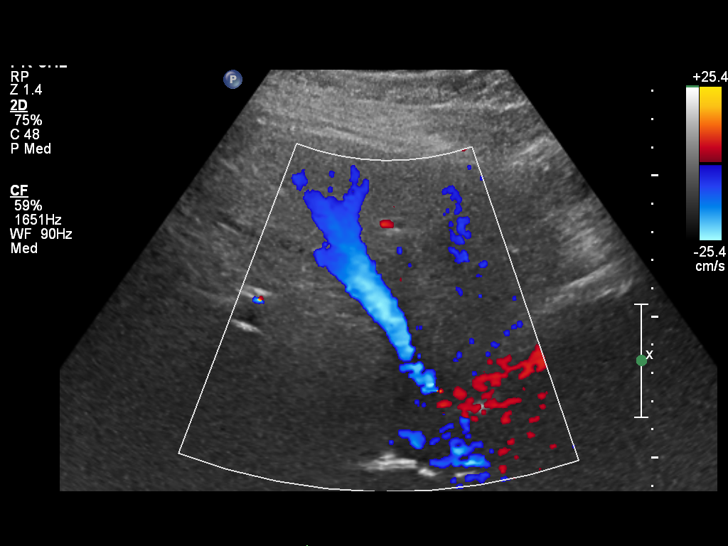
[im 105/126]
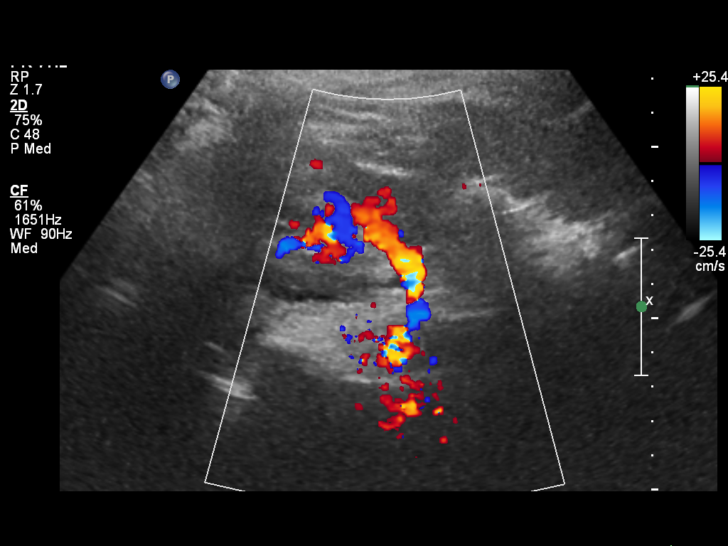
[im 115/126]
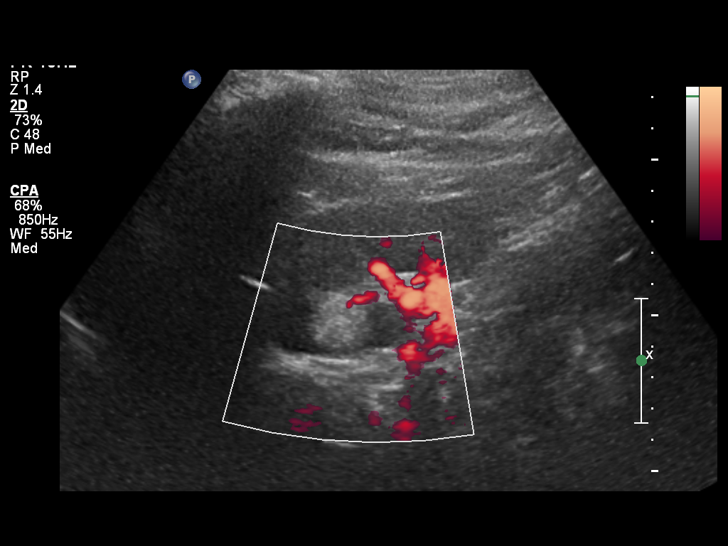
[im 126/126]
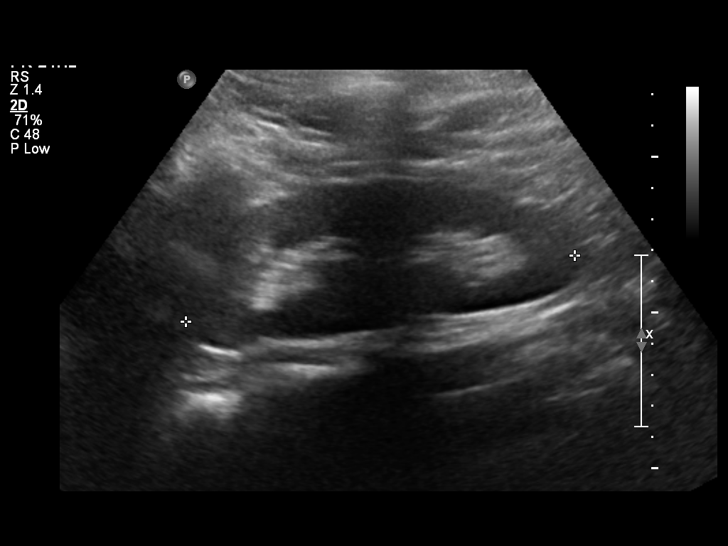

[13 of 25 positions shown; findings below may reference images not displayed]

FINDINGS: Gallbladder:

Solitary gallstone measuring 1.9 cm is noted in the neck of the
gallbladder. No gallbladder wall thickening or pericholecystic fluid
is noted. No sonographic Murphy's sign is noted.

Common bile duct:

Diameter: Measures 2.9 mm which is within normal limits.

Liver:

Increased echogenicity is noted consistent with fatty infiltration.
Focal sparing is noted around the gallbladder fossa.

IVC:

No abnormality visualized.

Pancreas:

Visualized portion unremarkable.

Spleen:

Size and appearance within normal limits.

Right Kidney:

Length: 12.7 cm. Echogenicity within normal limits. No mass or
hydronephrosis visualized.

Left Kidney:

Length: 12.6 cm. 2.6 cm echogenic focus is noted in midpole of left
kidney most consistent with angiomyolipoma. Similar but smaller
abnormality measuring 1.4 cm is also noted in the midpole.

Abdominal aorta:

No aneurysm visualized.

Other findings:

None.
IMPRESSION: Large solitary gallstone is noted without evidence of cholecystitis.

Two rounded echogenic foci are noted in the left renal cortex most
consistent with angiomyolipomas.

No other abnormality seen in the abdomen.

## 2017-04-02 ENCOUNTER — Encounter (HOSPITAL_COMMUNITY): Payer: Self-pay

## 2020-11-28 DIAGNOSIS — J3489 Other specified disorders of nose and nasal sinuses: Secondary | ICD-10-CM | POA: Diagnosis not present

## 2020-12-26 DIAGNOSIS — J329 Chronic sinusitis, unspecified: Secondary | ICD-10-CM | POA: Diagnosis not present

## 2020-12-26 DIAGNOSIS — J341 Cyst and mucocele of nose and nasal sinus: Secondary | ICD-10-CM | POA: Diagnosis not present

## 2021-01-30 DIAGNOSIS — Z01419 Encounter for gynecological examination (general) (routine) without abnormal findings: Secondary | ICD-10-CM | POA: Diagnosis not present

## 2021-01-30 DIAGNOSIS — Z6841 Body Mass Index (BMI) 40.0 and over, adult: Secondary | ICD-10-CM | POA: Diagnosis not present

## 2021-02-26 DIAGNOSIS — J329 Chronic sinusitis, unspecified: Secondary | ICD-10-CM | POA: Diagnosis not present

## 2021-02-26 DIAGNOSIS — J341 Cyst and mucocele of nose and nasal sinus: Secondary | ICD-10-CM | POA: Diagnosis not present

## 2021-02-26 DIAGNOSIS — J339 Nasal polyp, unspecified: Secondary | ICD-10-CM | POA: Diagnosis not present

## 2021-03-12 DIAGNOSIS — R448 Other symptoms and signs involving general sensations and perceptions: Secondary | ICD-10-CM | POA: Diagnosis not present

## 2021-03-12 DIAGNOSIS — J329 Chronic sinusitis, unspecified: Secondary | ICD-10-CM | POA: Diagnosis not present

## 2021-03-12 DIAGNOSIS — J069 Acute upper respiratory infection, unspecified: Secondary | ICD-10-CM | POA: Diagnosis not present

## 2021-03-20 DIAGNOSIS — Z13220 Encounter for screening for lipoid disorders: Secondary | ICD-10-CM | POA: Diagnosis not present

## 2021-03-20 DIAGNOSIS — N938 Other specified abnormal uterine and vaginal bleeding: Secondary | ICD-10-CM | POA: Diagnosis not present

## 2021-03-20 DIAGNOSIS — D259 Leiomyoma of uterus, unspecified: Secondary | ICD-10-CM | POA: Diagnosis not present

## 2021-03-20 DIAGNOSIS — Z0000 Encounter for general adult medical examination without abnormal findings: Secondary | ICD-10-CM | POA: Diagnosis not present

## 2021-03-26 DIAGNOSIS — H1013 Acute atopic conjunctivitis, bilateral: Secondary | ICD-10-CM | POA: Diagnosis not present

## 2021-03-26 DIAGNOSIS — R519 Headache, unspecified: Secondary | ICD-10-CM | POA: Diagnosis not present

## 2021-03-26 DIAGNOSIS — Z131 Encounter for screening for diabetes mellitus: Secondary | ICD-10-CM | POA: Diagnosis not present

## 2021-03-26 DIAGNOSIS — Z0000 Encounter for general adult medical examination without abnormal findings: Secondary | ICD-10-CM | POA: Diagnosis not present

## 2021-03-26 DIAGNOSIS — Z13220 Encounter for screening for lipoid disorders: Secondary | ICD-10-CM | POA: Diagnosis not present

## 2021-03-29 DIAGNOSIS — Z1231 Encounter for screening mammogram for malignant neoplasm of breast: Secondary | ICD-10-CM | POA: Diagnosis not present

## 2021-04-03 DIAGNOSIS — R059 Cough, unspecified: Secondary | ICD-10-CM | POA: Diagnosis not present

## 2021-04-03 DIAGNOSIS — J029 Acute pharyngitis, unspecified: Secondary | ICD-10-CM | POA: Diagnosis not present

## 2021-04-03 DIAGNOSIS — J209 Acute bronchitis, unspecified: Secondary | ICD-10-CM | POA: Diagnosis not present

## 2021-04-16 DIAGNOSIS — Z713 Dietary counseling and surveillance: Secondary | ICD-10-CM | POA: Diagnosis not present

## 2021-04-16 DIAGNOSIS — E782 Mixed hyperlipidemia: Secondary | ICD-10-CM | POA: Diagnosis not present

## 2021-04-16 DIAGNOSIS — Z13228 Encounter for screening for other metabolic disorders: Secondary | ICD-10-CM | POA: Diagnosis not present

## 2021-04-16 DIAGNOSIS — Z6841 Body Mass Index (BMI) 40.0 and over, adult: Secondary | ICD-10-CM | POA: Diagnosis not present

## 2021-04-18 DIAGNOSIS — J40 Bronchitis, not specified as acute or chronic: Secondary | ICD-10-CM | POA: Diagnosis not present

## 2021-04-25 DIAGNOSIS — R053 Chronic cough: Secondary | ICD-10-CM | POA: Diagnosis not present

## 2021-04-25 DIAGNOSIS — J40 Bronchitis, not specified as acute or chronic: Secondary | ICD-10-CM | POA: Diagnosis not present

## 2021-05-17 DIAGNOSIS — D250 Submucous leiomyoma of uterus: Secondary | ICD-10-CM | POA: Diagnosis not present

## 2021-05-17 DIAGNOSIS — Z6841 Body Mass Index (BMI) 40.0 and over, adult: Secondary | ICD-10-CM | POA: Diagnosis not present

## 2021-05-17 DIAGNOSIS — D259 Leiomyoma of uterus, unspecified: Secondary | ICD-10-CM | POA: Diagnosis not present

## 2021-05-17 DIAGNOSIS — N938 Other specified abnormal uterine and vaginal bleeding: Secondary | ICD-10-CM | POA: Diagnosis not present

## 2021-05-17 DIAGNOSIS — Z79899 Other long term (current) drug therapy: Secondary | ICD-10-CM | POA: Diagnosis not present

## 2021-05-17 DIAGNOSIS — N72 Inflammatory disease of cervix uteri: Secondary | ICD-10-CM | POA: Diagnosis not present

## 2021-05-17 DIAGNOSIS — Z885 Allergy status to narcotic agent status: Secondary | ICD-10-CM | POA: Diagnosis not present

## 2021-05-17 DIAGNOSIS — N888 Other specified noninflammatory disorders of cervix uteri: Secondary | ICD-10-CM | POA: Diagnosis not present

## 2021-05-17 DIAGNOSIS — D251 Intramural leiomyoma of uterus: Secondary | ICD-10-CM | POA: Diagnosis not present

## 2021-05-17 DIAGNOSIS — N939 Abnormal uterine and vaginal bleeding, unspecified: Secondary | ICD-10-CM | POA: Diagnosis not present

## 2021-05-17 DIAGNOSIS — K660 Peritoneal adhesions (postprocedural) (postinfection): Secondary | ICD-10-CM | POA: Diagnosis not present

## 2021-05-17 DIAGNOSIS — Z90710 Acquired absence of both cervix and uterus: Secondary | ICD-10-CM | POA: Insufficient documentation

## 2021-05-17 DIAGNOSIS — Z793 Long term (current) use of hormonal contraceptives: Secondary | ICD-10-CM | POA: Diagnosis not present

## 2021-05-17 HISTORY — PX: TOTAL ABDOMINAL HYSTERECTOMY W/ BILATERAL SALPINGOOPHORECTOMY: SHX83

## 2021-06-12 DIAGNOSIS — E782 Mixed hyperlipidemia: Secondary | ICD-10-CM | POA: Diagnosis not present

## 2021-06-12 DIAGNOSIS — Z713 Dietary counseling and surveillance: Secondary | ICD-10-CM | POA: Diagnosis not present

## 2021-06-12 DIAGNOSIS — Z6841 Body Mass Index (BMI) 40.0 and over, adult: Secondary | ICD-10-CM | POA: Diagnosis not present

## 2021-07-03 DIAGNOSIS — Z09 Encounter for follow-up examination after completed treatment for conditions other than malignant neoplasm: Secondary | ICD-10-CM | POA: Diagnosis not present

## 2021-07-03 DIAGNOSIS — Z130 Encounter for screening for diseases of the blood and blood-forming organs and certain disorders involving the immune mechanism: Secondary | ICD-10-CM | POA: Diagnosis not present

## 2021-11-06 DIAGNOSIS — M7662 Achilles tendinitis, left leg: Secondary | ICD-10-CM | POA: Diagnosis not present

## 2021-11-06 DIAGNOSIS — M79671 Pain in right foot: Secondary | ICD-10-CM | POA: Diagnosis not present

## 2021-11-06 DIAGNOSIS — M7661 Achilles tendinitis, right leg: Secondary | ICD-10-CM | POA: Diagnosis not present

## 2021-11-06 DIAGNOSIS — M7731 Calcaneal spur, right foot: Secondary | ICD-10-CM | POA: Diagnosis not present

## 2021-12-17 DIAGNOSIS — Z9884 Bariatric surgery status: Secondary | ICD-10-CM | POA: Diagnosis not present

## 2021-12-17 DIAGNOSIS — Z91048 Other nonmedicinal substance allergy status: Secondary | ICD-10-CM | POA: Diagnosis not present

## 2021-12-17 DIAGNOSIS — M216X1 Other acquired deformities of right foot: Secondary | ICD-10-CM | POA: Diagnosis not present

## 2021-12-17 DIAGNOSIS — M62461 Contracture of muscle, right lower leg: Secondary | ICD-10-CM | POA: Diagnosis not present

## 2021-12-17 DIAGNOSIS — M7661 Achilles tendinitis, right leg: Secondary | ICD-10-CM | POA: Diagnosis not present

## 2021-12-17 DIAGNOSIS — Z885 Allergy status to narcotic agent status: Secondary | ICD-10-CM | POA: Diagnosis not present

## 2021-12-17 DIAGNOSIS — M9261 Juvenile osteochondrosis of tarsus, right ankle: Secondary | ICD-10-CM | POA: Diagnosis not present

## 2021-12-17 DIAGNOSIS — X58XXXA Exposure to other specified factors, initial encounter: Secondary | ICD-10-CM | POA: Diagnosis not present

## 2021-12-17 DIAGNOSIS — M7751 Other enthesopathy of right foot: Secondary | ICD-10-CM | POA: Diagnosis not present

## 2021-12-17 DIAGNOSIS — M7662 Achilles tendinitis, left leg: Secondary | ICD-10-CM | POA: Diagnosis not present

## 2021-12-17 DIAGNOSIS — M62462 Contracture of muscle, left lower leg: Secondary | ICD-10-CM | POA: Diagnosis not present

## 2021-12-17 DIAGNOSIS — S86011A Strain of right Achilles tendon, initial encounter: Secondary | ICD-10-CM | POA: Diagnosis not present

## 2021-12-17 HISTORY — PX: ACHILLES TENDON SURGERY: SHX542

## 2021-12-18 DIAGNOSIS — M7661 Achilles tendinitis, right leg: Secondary | ICD-10-CM | POA: Diagnosis not present

## 2021-12-18 DIAGNOSIS — M7731 Calcaneal spur, right foot: Secondary | ICD-10-CM | POA: Diagnosis not present

## 2021-12-31 DIAGNOSIS — M7661 Achilles tendinitis, right leg: Secondary | ICD-10-CM | POA: Diagnosis not present

## 2022-01-15 DIAGNOSIS — M7662 Achilles tendinitis, left leg: Secondary | ICD-10-CM | POA: Diagnosis not present

## 2022-01-15 DIAGNOSIS — M9262 Juvenile osteochondrosis of tarsus, left ankle: Secondary | ICD-10-CM | POA: Diagnosis not present

## 2022-01-15 DIAGNOSIS — M7661 Achilles tendinitis, right leg: Secondary | ICD-10-CM | POA: Diagnosis not present

## 2022-01-15 DIAGNOSIS — M62462 Contracture of muscle, left lower leg: Secondary | ICD-10-CM | POA: Diagnosis not present

## 2022-01-31 DIAGNOSIS — U071 COVID-19: Secondary | ICD-10-CM | POA: Diagnosis not present

## 2022-04-01 DIAGNOSIS — Z1330 Encounter for screening examination for mental health and behavioral disorders, unspecified: Secondary | ICD-10-CM | POA: Diagnosis not present

## 2022-04-01 DIAGNOSIS — R053 Chronic cough: Secondary | ICD-10-CM | POA: Diagnosis not present

## 2022-04-01 DIAGNOSIS — R030 Elevated blood-pressure reading, without diagnosis of hypertension: Secondary | ICD-10-CM | POA: Diagnosis not present

## 2022-04-01 DIAGNOSIS — Z23 Encounter for immunization: Secondary | ICD-10-CM | POA: Diagnosis not present

## 2022-04-01 DIAGNOSIS — R92323 Mammographic fibroglandular density, bilateral breasts: Secondary | ICD-10-CM | POA: Diagnosis not present

## 2022-04-01 DIAGNOSIS — Z1231 Encounter for screening mammogram for malignant neoplasm of breast: Secondary | ICD-10-CM | POA: Diagnosis not present

## 2022-04-01 DIAGNOSIS — R5383 Other fatigue: Secondary | ICD-10-CM | POA: Diagnosis not present

## 2022-05-08 DIAGNOSIS — Z6841 Body Mass Index (BMI) 40.0 and over, adult: Secondary | ICD-10-CM | POA: Diagnosis not present

## 2022-06-12 DIAGNOSIS — L219 Seborrheic dermatitis, unspecified: Secondary | ICD-10-CM | POA: Diagnosis not present

## 2022-06-12 DIAGNOSIS — L732 Hidradenitis suppurativa: Secondary | ICD-10-CM | POA: Diagnosis not present

## 2022-06-12 DIAGNOSIS — L910 Hypertrophic scar: Secondary | ICD-10-CM | POA: Diagnosis not present

## 2022-06-13 DIAGNOSIS — Z885 Allergy status to narcotic agent status: Secondary | ICD-10-CM | POA: Diagnosis not present

## 2022-06-13 DIAGNOSIS — Z9884 Bariatric surgery status: Secondary | ICD-10-CM | POA: Diagnosis not present

## 2022-06-13 DIAGNOSIS — G43909 Migraine, unspecified, not intractable, without status migrainosus: Secondary | ICD-10-CM | POA: Diagnosis not present

## 2022-06-13 DIAGNOSIS — M7662 Achilles tendinitis, left leg: Secondary | ICD-10-CM | POA: Diagnosis not present

## 2022-06-13 DIAGNOSIS — M7661 Achilles tendinitis, right leg: Secondary | ICD-10-CM | POA: Diagnosis not present

## 2022-06-13 DIAGNOSIS — Z6841 Body Mass Index (BMI) 40.0 and over, adult: Secondary | ICD-10-CM | POA: Diagnosis not present

## 2022-06-13 DIAGNOSIS — M9262 Juvenile osteochondrosis of tarsus, left ankle: Secondary | ICD-10-CM | POA: Diagnosis not present

## 2022-06-13 DIAGNOSIS — M62462 Contracture of muscle, left lower leg: Secondary | ICD-10-CM | POA: Diagnosis not present

## 2022-06-13 DIAGNOSIS — Z9109 Other allergy status, other than to drugs and biological substances: Secondary | ICD-10-CM | POA: Diagnosis not present

## 2022-06-13 DIAGNOSIS — M216X2 Other acquired deformities of left foot: Secondary | ICD-10-CM | POA: Diagnosis not present

## 2022-06-13 HISTORY — PX: ACHILLES TENDON SURGERY: SHX542

## 2022-07-11 DIAGNOSIS — M7662 Achilles tendinitis, left leg: Secondary | ICD-10-CM | POA: Diagnosis not present

## 2022-07-11 DIAGNOSIS — Z9889 Other specified postprocedural states: Secondary | ICD-10-CM | POA: Diagnosis not present

## 2022-08-08 DIAGNOSIS — Z6841 Body Mass Index (BMI) 40.0 and over, adult: Secondary | ICD-10-CM | POA: Diagnosis not present

## 2022-08-09 ENCOUNTER — Ambulatory Visit (INDEPENDENT_AMBULATORY_CARE_PROVIDER_SITE_OTHER): Payer: BC Managed Care – PPO | Admitting: Family Medicine

## 2022-08-09 ENCOUNTER — Encounter: Payer: Self-pay | Admitting: Family Medicine

## 2022-08-09 ENCOUNTER — Other Ambulatory Visit: Payer: Self-pay

## 2022-08-09 ENCOUNTER — Ambulatory Visit (HOSPITAL_COMMUNITY)
Admission: RE | Admit: 2022-08-09 | Discharge: 2022-08-09 | Disposition: A | Discharge status: Home / Self Care | Payer: BC Managed Care – PPO | Source: Ambulatory Visit | Attending: Family Medicine | Admitting: Family Medicine

## 2022-08-09 VITALS — BP 141/83 | HR 75 | Temp 98.2°F | Resp 18 | Ht 72.0 in | Wt 315.7 lb

## 2022-08-09 DIAGNOSIS — Z136 Encounter for screening for cardiovascular disorders: Secondary | ICD-10-CM | POA: Diagnosis not present

## 2022-08-09 DIAGNOSIS — Z7689 Persons encountering health services in other specified circumstances: Secondary | ICD-10-CM

## 2022-08-09 DIAGNOSIS — Z13220 Encounter for screening for lipoid disorders: Secondary | ICD-10-CM

## 2022-08-09 NOTE — Progress Notes (Signed)
New Patient Office Visit  Subjective    Patient ID: Courtney Mcgee, female    DOB: 1980/06/20  Age: 43 y.o. MRN: [id number]  CC:  Chief Complaint  Patient presents with   Establish Care    Patient is here to establish care , she states that she is in the process of getting bariatric surgery    HPI Courtney Mcgee presents to establish care  Pt reports she has a few requirements for bariatric surgery. She will be going through a company in [place]. She hasn't seen them yet. She reports she had blood work done, seen psychiatry, and has nutritionist appt. She needs the EKG done by her.  She is only taking zyrtec prn for seasonal allergies.  She has allergies to cats, Mcgee, and hydrocodone. Pt has had gastric sleeve in 2014. 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     Outpatient Encounter Medications as of 08/09/2022  Medication Sig   cetirizine (ZYRTEC) 10 MG tablet Take 10 mg by mouth daily as needed for allergies.   HYDROcodone-acetaminophen (HYCET) 7.5-325 mg/15 ml solution Take 15 mLs by mouth every 4 (four) hours as needed for moderate pain.   HYDROcodone-acetaminophen (NORCO/VICODIN) 5-325 MG per tablet Take 1 tablet by mouth every 6 (six) hours as needed (Cycle pain).   ibuprofen (ADVIL,MOTRIN) 800 MG tablet Take 800 mg by mouth every 8 (eight) hours as needed (Pain).   SPRINTEC 28 0.25-35 MG-MCG tablet Take 1 tablet by mouth daily.    Vitamin D, Ergocalciferol, (DRISDOL) 50000 UNITS CAPS capsule Take 50,000 Units by mouth every 7 (seven) days. Thursdays (Patient not taking: Reported on 08/09/2022)   No facility-administered encounter medications on file as of 08/09/2022.    Past Medical History:  Diagnosis Date   Psoriasis    Refusal of blood transfusions as patient is Jehovah's Witness     Past Surgical History:  Procedure Laterality Date   BREATH TEK H PYLORI N/A 07/16/2013   Procedure: BREATH TEK H PYLORI;   Surgeon: Valarie Merino, MD;  Location: [facility];  Service: General;  Laterality: N/A;   CESAREAN SECTION     LAPAROSCOPIC GASTRIC SLEEVE RESECTION N/A 08/30/2013   Procedure: LAPAROSCOPIC GASTRIC SLEEVE RESECTION WITH UPPER ENDOSCOPY;  Surgeon: Valarie Merino, MD;  Location: [facility];  Service: General;  Laterality: N/A;   TONSILLECTOMY     WISDOM TOOTH EXTRACTION      Family History  Problem Relation Age of Onset   Heart disease Father    Hypertension Other    Hyperlipidemia Other    Heart disease Other    Diabetes Other     Social History   Socioeconomic History   Marital status: Married    Spouse name: Not on file   Number of children: Not on file   Years of education: Not on file   Highest education level: Not on file  Occupational History   Not on file  Tobacco Use   Smoking status: Never    Passive exposure: Never   Smokeless tobacco: Never  Substance and Sexual Activity   Alcohol use: Yes    Comment: occ   Drug use: Never   Sexual activity: Not on file  Other Topics Concern   Not on file  Social History Narrative   Not on file   Social Determinants of Health   Financial Resource Strain: Not on file  Food Insecurity: Not on file  Transportation Needs:  Not on file  Physical Activity: Not on file  Stress: Not on file  Social Connections: Not on file  Intimate Partner Violence: Not on file    Review of Systems  All other systems reviewed and are negative.      Objective    BP (!) 141/83   Pulse 75   Temp 98.2 F (36.8 C) (Oral)   Resp 18   Ht 6' (1.829 m)   Wt (!) 315 lb 11.2 oz (143.2 kg)   LMP 08/18/2013   SpO2 100%   BMI 42.82 kg/m   Physical Exam Vitals and nursing note reviewed.  Constitutional:      Appearance: Normal appearance. She is obese.  HENT:     Head: Normocephalic and atraumatic.     Right Ear: External ear normal.     Left Ear: External ear normal.     Nose: Nose normal.     Mouth/Throat:     Mouth: Mucous  membranes are moist.  Eyes:     Pupils: Pupils are equal, round, and reactive to light.  Cardiovascular:     Rate and Rhythm: Normal rate and regular rhythm.     Pulses: Normal pulses.     Heart sounds: Normal heart sounds.  Pulmonary:     Effort: Pulmonary effort is normal.     Breath sounds: Normal breath sounds.  Abdominal:     General: Abdomen is flat.  Skin:    Capillary Refill: Capillary refill takes less than 2 seconds.  Neurological:     General: No focal deficit present.     Mental Status: She is alert and oriented to person, place, and time. Mental status is at baseline.  Psychiatric:        Mood and Affect: Mood normal.        Behavior: Behavior normal.        Thought Content: Thought content normal.        Judgment: Judgment normal.       Assessment & Plan:   Problem List Items Addressed This Visit   None  Encounter to establish care with new doctor  Encounter for lipid screening for cardiovascular disease -     EKG 12-Lead  Morbid obesity (HCC)  Pt needing EKG today for preop evaluation for bariatric surgery EKG NSR with septal infarct age indeterminate.  Pt without any red flag symptoms other than mildly elevated blood pressure Faxed to [facility] per pt's request. To follow up for CPE/labs No follow-ups on file.   Suzan Slick, MD

## 2022-08-26 DIAGNOSIS — L732 Hidradenitis suppurativa: Secondary | ICD-10-CM | POA: Diagnosis not present

## 2022-08-26 DIAGNOSIS — L910 Hypertrophic scar: Secondary | ICD-10-CM | POA: Diagnosis not present

## 2022-09-09 DIAGNOSIS — K449 Diaphragmatic hernia without obstruction or gangrene: Secondary | ICD-10-CM | POA: Diagnosis not present

## 2022-09-09 DIAGNOSIS — Z9884 Bariatric surgery status: Secondary | ICD-10-CM | POA: Diagnosis not present

## 2022-09-09 DIAGNOSIS — K219 Gastro-esophageal reflux disease without esophagitis: Secondary | ICD-10-CM | POA: Diagnosis not present

## 2022-09-11 ENCOUNTER — Telehealth: Payer: Self-pay

## 2022-09-11 NOTE — Telephone Encounter (Signed)
Patient called requesting for EKG that was completed on 08/09/2022, to be faxed to Dr. Cleaster CorinBelenda Cruise [phone]

## 2022-09-17 DIAGNOSIS — M9262 Juvenile osteochondrosis of tarsus, left ankle: Secondary | ICD-10-CM | POA: Diagnosis not present

## 2022-09-17 DIAGNOSIS — M7662 Achilles tendinitis, left leg: Secondary | ICD-10-CM | POA: Diagnosis not present

## 2022-09-17 DIAGNOSIS — Z9889 Other specified postprocedural states: Secondary | ICD-10-CM | POA: Diagnosis not present

## 2022-09-17 DIAGNOSIS — M62462 Contracture of muscle, left lower leg: Secondary | ICD-10-CM | POA: Diagnosis not present

## 2022-09-23 DIAGNOSIS — Z9884 Bariatric surgery status: Secondary | ICD-10-CM | POA: Diagnosis not present

## 2022-09-23 DIAGNOSIS — K3189 Other diseases of stomach and duodenum: Secondary | ICD-10-CM | POA: Diagnosis not present

## 2022-09-23 DIAGNOSIS — K2970 Gastritis, unspecified, without bleeding: Secondary | ICD-10-CM | POA: Diagnosis not present

## 2022-09-23 DIAGNOSIS — K319 Disease of stomach and duodenum, unspecified: Secondary | ICD-10-CM | POA: Diagnosis not present

## 2022-09-23 DIAGNOSIS — K269 Duodenal ulcer, unspecified as acute or chronic, without hemorrhage or perforation: Secondary | ICD-10-CM | POA: Diagnosis not present

## 2022-09-24 DIAGNOSIS — Z1331 Encounter for screening for depression: Secondary | ICD-10-CM | POA: Diagnosis not present

## 2022-09-24 DIAGNOSIS — M7662 Achilles tendinitis, left leg: Secondary | ICD-10-CM | POA: Diagnosis not present

## 2022-09-24 DIAGNOSIS — Z6841 Body Mass Index (BMI) 40.0 and over, adult: Secondary | ICD-10-CM | POA: Diagnosis not present

## 2022-09-24 DIAGNOSIS — M9262 Juvenile osteochondrosis of tarsus, left ankle: Secondary | ICD-10-CM | POA: Diagnosis not present

## 2022-09-24 DIAGNOSIS — M62462 Contracture of muscle, left lower leg: Secondary | ICD-10-CM | POA: Diagnosis not present

## 2022-09-24 DIAGNOSIS — Z01419 Encounter for gynecological examination (general) (routine) without abnormal findings: Secondary | ICD-10-CM | POA: Diagnosis not present

## 2022-09-24 DIAGNOSIS — Z9889 Other specified postprocedural states: Secondary | ICD-10-CM | POA: Diagnosis not present

## 2022-10-02 DIAGNOSIS — Z6841 Body Mass Index (BMI) 40.0 and over, adult: Secondary | ICD-10-CM | POA: Diagnosis not present

## 2022-10-07 DIAGNOSIS — M7662 Achilles tendinitis, left leg: Secondary | ICD-10-CM | POA: Diagnosis not present

## 2022-10-07 DIAGNOSIS — M62462 Contracture of muscle, left lower leg: Secondary | ICD-10-CM | POA: Diagnosis not present

## 2022-10-07 DIAGNOSIS — Z9889 Other specified postprocedural states: Secondary | ICD-10-CM | POA: Diagnosis not present

## 2022-10-07 DIAGNOSIS — M9262 Juvenile osteochondrosis of tarsus, left ankle: Secondary | ICD-10-CM | POA: Diagnosis not present

## 2022-10-09 ENCOUNTER — Encounter: Payer: Self-pay | Admitting: Family Medicine

## 2022-10-15 DIAGNOSIS — M62462 Contracture of muscle, left lower leg: Secondary | ICD-10-CM | POA: Diagnosis not present

## 2022-10-15 DIAGNOSIS — M7662 Achilles tendinitis, left leg: Secondary | ICD-10-CM | POA: Diagnosis not present

## 2022-10-15 DIAGNOSIS — M62461 Contracture of muscle, right lower leg: Secondary | ICD-10-CM | POA: Diagnosis not present

## 2022-10-15 DIAGNOSIS — M7661 Achilles tendinitis, right leg: Secondary | ICD-10-CM | POA: Diagnosis not present

## 2022-10-21 DIAGNOSIS — Z9889 Other specified postprocedural states: Secondary | ICD-10-CM | POA: Diagnosis not present

## 2022-10-21 DIAGNOSIS — M62462 Contracture of muscle, left lower leg: Secondary | ICD-10-CM | POA: Diagnosis not present

## 2022-10-21 DIAGNOSIS — M7662 Achilles tendinitis, left leg: Secondary | ICD-10-CM | POA: Diagnosis not present

## 2022-10-21 DIAGNOSIS — M9262 Juvenile osteochondrosis of tarsus, left ankle: Secondary | ICD-10-CM | POA: Diagnosis not present

## 2022-11-28 DIAGNOSIS — K449 Diaphragmatic hernia without obstruction or gangrene: Secondary | ICD-10-CM | POA: Diagnosis not present

## 2022-11-28 DIAGNOSIS — K219 Gastro-esophageal reflux disease without esophagitis: Secondary | ICD-10-CM | POA: Diagnosis not present

## 2022-11-28 DIAGNOSIS — R1310 Dysphagia, unspecified: Secondary | ICD-10-CM | POA: Diagnosis not present

## 2022-11-28 DIAGNOSIS — R1110 Vomiting, unspecified: Secondary | ICD-10-CM | POA: Diagnosis not present

## 2022-12-03 DIAGNOSIS — M7662 Achilles tendinitis, left leg: Secondary | ICD-10-CM | POA: Diagnosis not present

## 2022-12-03 DIAGNOSIS — Z4789 Encounter for other orthopedic aftercare: Secondary | ICD-10-CM | POA: Diagnosis not present

## 2022-12-03 DIAGNOSIS — M62462 Contracture of muscle, left lower leg: Secondary | ICD-10-CM | POA: Diagnosis not present

## 2022-12-03 DIAGNOSIS — M9262 Juvenile osteochondrosis of tarsus, left ankle: Secondary | ICD-10-CM | POA: Diagnosis not present

## 2022-12-27 DIAGNOSIS — Z9889 Other specified postprocedural states: Secondary | ICD-10-CM | POA: Diagnosis not present

## 2022-12-27 DIAGNOSIS — M62462 Contracture of muscle, left lower leg: Secondary | ICD-10-CM | POA: Diagnosis not present

## 2022-12-27 DIAGNOSIS — M7662 Achilles tendinitis, left leg: Secondary | ICD-10-CM | POA: Diagnosis not present

## 2022-12-27 DIAGNOSIS — M9262 Juvenile osteochondrosis of tarsus, left ankle: Secondary | ICD-10-CM | POA: Diagnosis not present

## 2023-01-06 DIAGNOSIS — M7662 Achilles tendinitis, left leg: Secondary | ICD-10-CM | POA: Diagnosis not present

## 2023-01-06 DIAGNOSIS — Z9889 Other specified postprocedural states: Secondary | ICD-10-CM | POA: Diagnosis not present

## 2023-01-06 DIAGNOSIS — M62462 Contracture of muscle, left lower leg: Secondary | ICD-10-CM | POA: Diagnosis not present

## 2023-01-06 DIAGNOSIS — M9262 Juvenile osteochondrosis of tarsus, left ankle: Secondary | ICD-10-CM | POA: Diagnosis not present

## 2023-01-13 DIAGNOSIS — M9262 Juvenile osteochondrosis of tarsus, left ankle: Secondary | ICD-10-CM | POA: Diagnosis not present

## 2023-01-13 DIAGNOSIS — Z9889 Other specified postprocedural states: Secondary | ICD-10-CM | POA: Diagnosis not present

## 2023-01-13 DIAGNOSIS — M7662 Achilles tendinitis, left leg: Secondary | ICD-10-CM | POA: Diagnosis not present

## 2023-01-13 DIAGNOSIS — M62462 Contracture of muscle, left lower leg: Secondary | ICD-10-CM | POA: Diagnosis not present

## 2023-01-14 DIAGNOSIS — R200 Anesthesia of skin: Secondary | ICD-10-CM | POA: Diagnosis not present

## 2023-01-29 DIAGNOSIS — R200 Anesthesia of skin: Secondary | ICD-10-CM | POA: Diagnosis not present

## 2023-01-30 DIAGNOSIS — R208 Other disturbances of skin sensation: Secondary | ICD-10-CM | POA: Diagnosis not present

## 2023-01-30 DIAGNOSIS — R200 Anesthesia of skin: Secondary | ICD-10-CM | POA: Diagnosis not present

## 2023-01-30 DIAGNOSIS — R202 Paresthesia of skin: Secondary | ICD-10-CM | POA: Diagnosis not present

## 2023-02-10 DIAGNOSIS — M5127 Other intervertebral disc displacement, lumbosacral region: Secondary | ICD-10-CM | POA: Diagnosis not present

## 2023-03-10 DIAGNOSIS — M47816 Spondylosis without myelopathy or radiculopathy, lumbar region: Secondary | ICD-10-CM | POA: Diagnosis not present

## 2023-03-10 DIAGNOSIS — G894 Chronic pain syndrome: Secondary | ICD-10-CM | POA: Diagnosis not present

## 2023-03-10 DIAGNOSIS — M48061 Spinal stenosis, lumbar region without neurogenic claudication: Secondary | ICD-10-CM | POA: Diagnosis not present

## 2023-03-10 DIAGNOSIS — M5416 Radiculopathy, lumbar region: Secondary | ICD-10-CM | POA: Diagnosis not present

## 2023-03-18 DIAGNOSIS — N938 Other specified abnormal uterine and vaginal bleeding: Secondary | ICD-10-CM | POA: Diagnosis not present

## 2023-03-18 DIAGNOSIS — N952 Postmenopausal atrophic vaginitis: Secondary | ICD-10-CM | POA: Diagnosis not present

## 2023-03-18 DIAGNOSIS — R102 Pelvic and perineal pain: Secondary | ICD-10-CM | POA: Diagnosis not present

## 2023-03-20 DIAGNOSIS — R102 Pelvic and perineal pain: Secondary | ICD-10-CM | POA: Diagnosis not present

## 2023-03-20 DIAGNOSIS — N938 Other specified abnormal uterine and vaginal bleeding: Secondary | ICD-10-CM | POA: Diagnosis not present

## 2023-04-07 DIAGNOSIS — Z1231 Encounter for screening mammogram for malignant neoplasm of breast: Secondary | ICD-10-CM | POA: Diagnosis not present

## 2023-04-07 DIAGNOSIS — R92323 Mammographic fibroglandular density, bilateral breasts: Secondary | ICD-10-CM | POA: Diagnosis not present

## 2023-04-17 DIAGNOSIS — M47816 Spondylosis without myelopathy or radiculopathy, lumbar region: Secondary | ICD-10-CM | POA: Diagnosis not present

## 2023-04-17 DIAGNOSIS — M48061 Spinal stenosis, lumbar region without neurogenic claudication: Secondary | ICD-10-CM | POA: Diagnosis not present

## 2023-05-30 DIAGNOSIS — M47816 Spondylosis without myelopathy or radiculopathy, lumbar region: Secondary | ICD-10-CM | POA: Diagnosis not present

## 2023-05-30 DIAGNOSIS — M5416 Radiculopathy, lumbar region: Secondary | ICD-10-CM | POA: Diagnosis not present

## 2023-05-30 DIAGNOSIS — G894 Chronic pain syndrome: Secondary | ICD-10-CM | POA: Diagnosis not present

## 2023-05-30 DIAGNOSIS — M48061 Spinal stenosis, lumbar region without neurogenic claudication: Secondary | ICD-10-CM | POA: Diagnosis not present

## 2023-06-23 DIAGNOSIS — M48061 Spinal stenosis, lumbar region without neurogenic claudication: Secondary | ICD-10-CM | POA: Diagnosis not present

## 2023-06-23 DIAGNOSIS — M47816 Spondylosis without myelopathy or radiculopathy, lumbar region: Secondary | ICD-10-CM | POA: Diagnosis not present

## 2023-07-08 DIAGNOSIS — Z713 Dietary counseling and surveillance: Secondary | ICD-10-CM | POA: Diagnosis not present

## 2023-07-08 DIAGNOSIS — Z6833 Body mass index (BMI) 33.0-33.9, adult: Secondary | ICD-10-CM | POA: Diagnosis not present

## 2023-07-08 DIAGNOSIS — K219 Gastro-esophageal reflux disease without esophagitis: Secondary | ICD-10-CM | POA: Diagnosis not present

## 2023-07-18 DIAGNOSIS — Z713 Dietary counseling and surveillance: Secondary | ICD-10-CM | POA: Diagnosis not present

## 2023-07-18 DIAGNOSIS — K219 Gastro-esophageal reflux disease without esophagitis: Secondary | ICD-10-CM | POA: Diagnosis not present

## 2023-08-08 ENCOUNTER — Ambulatory Visit

## 2023-08-08 ENCOUNTER — Encounter: Payer: Self-pay | Admitting: Family Medicine

## 2023-08-08 ENCOUNTER — Ambulatory Visit: Payer: Self-pay

## 2023-08-08 ENCOUNTER — Ambulatory Visit (INDEPENDENT_AMBULATORY_CARE_PROVIDER_SITE_OTHER): Admitting: Family Medicine

## 2023-08-08 ENCOUNTER — Ambulatory Visit: Payer: Self-pay | Admitting: Family Medicine

## 2023-08-08 VITALS — BP 140/90 | HR 67 | Temp 98.9°F | Ht 72.0 in | Wt 250.0 lb

## 2023-08-08 DIAGNOSIS — R109 Unspecified abdominal pain: Secondary | ICD-10-CM | POA: Diagnosis not present

## 2023-08-08 DIAGNOSIS — R1011 Right upper quadrant pain: Secondary | ICD-10-CM | POA: Diagnosis not present

## 2023-08-08 DIAGNOSIS — Z9884 Bariatric surgery status: Secondary | ICD-10-CM

## 2023-08-08 DIAGNOSIS — L732 Hidradenitis suppurativa: Secondary | ICD-10-CM | POA: Diagnosis not present

## 2023-08-08 DIAGNOSIS — R252 Cramp and spasm: Secondary | ICD-10-CM | POA: Insufficient documentation

## 2023-08-08 DIAGNOSIS — R030 Elevated blood-pressure reading, without diagnosis of hypertension: Secondary | ICD-10-CM | POA: Insufficient documentation

## 2023-08-08 DIAGNOSIS — K8020 Calculus of gallbladder without cholecystitis without obstruction: Secondary | ICD-10-CM | POA: Diagnosis not present

## 2023-08-08 LAB — POCT URINALYSIS DIP (CLINITEK)
Bilirubin, UA: NEGATIVE
Blood, UA: NEGATIVE
Glucose, UA: NEGATIVE mg/dL
Ketones, POC UA: NEGATIVE mg/dL
Leukocytes, UA: NEGATIVE
Nitrite, UA: NEGATIVE
POC PROTEIN,UA: NEGATIVE
Spec Grav, UA: 1.015 (ref 1.010–1.025)
Urobilinogen, UA: 0.2 U/dL
pH, UA: 5.5 (ref 5.0–8.0)

## 2023-08-08 NOTE — Assessment & Plan Note (Signed)
 Blood pressure usually under control since bariatric surgery. Reports being on work call prior to this visit and getting 2 additional work projects. She will monitor blood pressure at home and follow-up with PCP if blood pressure does not return to 130/80.

## 2023-08-08 NOTE — Progress Notes (Signed)
 Acute Office Visit  Subjective:     Patient ID: Courtney Mcgee, female    DOB: 1980/11/04, 43 y.o.   MRN: [id number]  Chief Complaint  Patient presents with   Medical Management of Chronic Issues    Right side flank pain onset 2 weeks Bilateral foot cramping onset 3 weeks Boils hx of them in the past, increased flare ups, located on lower pelvis area and  underarm,  pt is requesting medication (Topical cream) And referral to skin removal surgery      HPI Patient is in today for right side and feet cramping.   Hidradenitis  Suppurativa:  Boils on lower abdominal area: has seen derm to get injected. Has lots of loose skin due to bariatric surgery. No active boils currently. Typically under arms, now in private area Has derm to see; does not need referral today.   Cramping in foot arches: both feet. Electrolytes normal. Does not drink enough water.  Heat and massage before bed  Flank pain: right side for 2 weeks. No urinary symptoms. Tender to touch on right flank and side.  No injury.  No nausea or vomiting or fever. No bruising.  No injury. No heavy lifting.  Feels it with touching and deep breathing.  Heating pad did not help.    Chart review: 07/17/23 labs  Sodium 142, K+ 4.1 Magnesium 1.8 on 07/17/23 Vitamin A  25.7 Vitamin E 11.5/1.1 B12 456 Folate 6.7 CBC: normal  Vitamin D: 23  supplements   ROS      Objective:    BP (!) 140/90 (Cuff Size: Large)   Pulse 67   Temp 98.9 F (37.2 C) (Oral)   Ht 6' (1.829 m)   Wt 250 lb (113.4 kg)   LMP 08/18/2013   SpO2 99%   BMI 33.91 kg/m  BP Readings from Last 3 Encounters:  08/08/23 (!) 140/90  08/09/22 (!) 141/83  09/29/13 122/74      Physical Exam Vitals and nursing note reviewed.  Constitutional:      General: She is not in acute distress.    Appearance: Normal appearance.   Cardiovascular:     Rate and Rhythm: Normal rate and regular rhythm.     Heart sounds: Normal heart sounds.   Pulmonary:     Effort: Pulmonary effort is normal.     Breath sounds: Normal breath sounds.  Abdominal:     Tenderness: There is abdominal tenderness in the right upper quadrant. There is no right CVA tenderness or left CVA tenderness.   Skin:    General: Skin is warm and dry.   Neurological:     General: No focal deficit present.     Mental Status: She is alert. Mental status is at baseline.   Psychiatric:        Mood and Affect: Mood normal.        Behavior: Behavior normal.        Thought Content: Thought content normal.        Judgment: Judgment normal.    Results for orders placed or performed in visit on 08/08/23  POCT URINALYSIS DIP (CLINITEK)  Result Value Ref Range   Color, UA yellow yellow   Clarity, UA clear clear   Glucose, UA negative negative mg/dL   Bilirubin, UA negative negative   Ketones, POC UA negative negative mg/dL   Spec Grav, UA 8.984 8.989 - 1.025   Blood, UA negative negative   pH, UA 5.5 5.0 - 8.0   POC  PROTEIN,UA negative negative, trace   Urobilinogen, UA 0.2 0.2 or 1.0 E.U./dL   Nitrite, UA Negative Negative   Leukocytes, UA Negative Negative        Assessment & Plan:   Problem List Items Addressed This Visit     Hidradenitis suppurativa   This is a chronic condition that has gotten worse in the areas of loose skin from weight loss. Does not have current boil. Will follow-up with derm as needed.        Acute right flank pain   Tender to touch and with movement. No right CVA tenderness. Urine dip negative today. No urinary symptoms. No fever.  Stat abdominal ultrasound which will visualize kidneys.          Relevant Orders   POCT URINALYSIS DIP (CLINITEK) (Completed)   US  Abdomen Complete   Right upper quadrant abdominal pain - Primary   Pain in right flank and RUQ worse with movement and deep breathing. No known injury. No ecchymosis noted today. Tender to palpation. No nausea, vomiting, or fever. Due to history  of gastric sleeve, will get abdominal ultrasound for further evaluation.  Discussed possiblity of this being muscular in nature. Unable to use NSAIDs due to bariatric surgery.  Follow-up with PCP if symptoms do not resolve.       Elevated blood pressure reading in office without diagnosis of hypertension   Blood pressure usually under control since bariatric surgery. Reports being on work call prior to this visit and getting 2 additional work projects. She will monitor blood pressure at home and follow-up with PCP if blood pressure does not return to 130/80.       Cramping of feet   Occurs at night awakening her from sleep. Recent labs on 6/5 reveal normal electrolytes. Endorses not drinking enough water. Recommendations: Increase hydration, add electrolyte packet to water at least once per day (no added sugar).  Use heating pad and message feet prior to bed time. Follow-up with PCP if symptoms do not improve with supportive therapy.       Agrees with plan of care discussed.  Questions answered.      Return if symptoms worsen or fail to improve, for if blood pressure does not return to normal and if right side pain does not resolve .  Courtney JONELLE Brownie, FNP

## 2023-08-08 NOTE — Assessment & Plan Note (Signed)
 This is a chronic condition that has gotten worse in the areas of loose skin from weight loss. Does not have current boil. Will follow-up with derm as needed.

## 2023-08-08 NOTE — Telephone Encounter (Signed)
 FYI Only or Action Required?: FYI only for provider.  Patient was last seen in primary care on 08/09/2022 by Colette Torrence GRADE, MD. Called Nurse Triage reporting Feet Cramping. Symptoms began SEVERAL WEEKS. Interventions attempted: Nothing. Symptoms are: gradually worsening.  Triage Disposition: See PCP When Office is Open (Within 3 Days)  Patient/caregiver understands and will follow disposition?: Yes     Appointment made for todaty 08/08/2023 at 10:50 AM with [name].   FYI Only or Action Required?: FYI only for provider.  Patient was last seen in primary care on 08/09/2022 by Colette Torrence GRADE, MD. Called Nurse Triage reporting Feet Cramping. Symptoms began several weeks ago. Interventions attempted: Rest, hydration, or home remedies. Symptoms are: gradually worsening.  Triage Disposition: See PCP When Office is Open (Within 3 Days)  Patient/caregiver understands and will follow disposition?: Yes         Appointment today 08/08/2023 at 10:50 AM with [name].                        Copied from CRM [id number]. Topic: Clinical - Red Word Triage >> Aug 08, 2023  9:16 AM [name] wrote: Red Word that prompted transfer to Nurse Triage: Side Pain and feet cramping   ----------------------------------------------------------------------- From previous Reason for Contact - Scheduling: Patient/patient representative is calling to schedule an appointment. Refer to attachments for appointment information. Reason for Disposition  [1] MODERATE pain (e.g., interferes with normal activities) AND [2] present > 3 days  Answer Assessment - Initial Assessment Questions 1. ONSET: When did the muscle aches or body pains start?      Several weeks ago 2. LOCATION: What part of your body is hurting? (e.g., entire body, arms, legs)      Right side near hip/waistline on her side---feels like a bruised area but patient denies any known injuries & also both  feet will periodically experience cramping. 3. SEVERITY: How bad is the pain? (Scale 1-10; or mild, moderate, severe)   - MILD (1-3): doesn't interfere with normal activities    - MODERATE (4-7): interferes with normal activities or awakens from sleep    - SEVERE (8-10):  excruciating pain, unable to do any normal activities      Mild/moderate 4. CAUSE: What do you think is causing the pains?    Unsure but Vitamin D levels were low recently 5. FEVER: Have you been having fever?     No 6. OTHER SYMPTOMS: Do you have any other symptoms? (e.g., chest pain, weakness, rash, cold or flu symptoms, weight loss)    Patient denies    Patient states that she has a tender area on her right side near the waistline/hip back area---patient had a hard time explaining exactly where it was but she states it just felt tender like a bruise She denies any symptoms such as chest pain, difficulty breathing, difficulty eating or drinking. She states that she had bariatric surgery August 21st---saw this doctor recently and things were fine She was advised to see her PCP about boils that have been appearing on her body since that surgery. She also states that her Vitamin D levels were low recently as well. Patient is advised that if anything gets worse to go to the Emergency Room---She verbalized understanding.  Protocols used: Muscle Aches and Body Pain-A-AH

## 2023-08-08 NOTE — Assessment & Plan Note (Addendum)
 Tender to touch and with movement. No right CVA tenderness. Urine dip negative today. No urinary symptoms. No fever.  Stat abdominal ultrasound which will visualize kidneys.

## 2023-08-08 NOTE — Assessment & Plan Note (Signed)
 Pain in right flank and RUQ worse with movement and deep breathing. No known injury. No ecchymosis noted today. Tender to palpation. No nausea, vomiting, or fever. Due to history of gastric sleeve, will get abdominal ultrasound for further evaluation.  Discussed possiblity of this being muscular in nature. Unable to use NSAIDs due to bariatric surgery.  Follow-up with PCP if symptoms do not resolve.

## 2023-08-08 NOTE — Patient Instructions (Addendum)
 [facility]  [address]  The radiology department is on the first floor which is best accessed by going around to the back of the building. No appointment necessary. You can go at your convenience.

## 2023-08-08 NOTE — Assessment & Plan Note (Signed)
 Occurs at night awakening her from sleep. Recent labs on 6/5 reveal normal electrolytes. Endorses not drinking enough water. Recommendations: Increase hydration, add electrolyte packet to water at least once per day (no added sugar).  Use heating pad and message feet prior to bed time. Follow-up with PCP if symptoms do not improve with supportive therapy.

## 2023-10-31 ENCOUNTER — Ambulatory Visit (INDEPENDENT_AMBULATORY_CARE_PROVIDER_SITE_OTHER): Admitting: Family Medicine

## 2023-10-31 ENCOUNTER — Encounter: Payer: Self-pay | Admitting: Family Medicine

## 2023-10-31 VITALS — BP 118/76 | HR 67 | Temp 98.0°F | Resp 18 | Ht 72.0 in | Wt 259.0 lb

## 2023-10-31 DIAGNOSIS — R635 Abnormal weight gain: Secondary | ICD-10-CM | POA: Diagnosis not present

## 2023-10-31 DIAGNOSIS — Z6835 Body mass index (BMI) 35.0-35.9, adult: Secondary | ICD-10-CM | POA: Diagnosis not present

## 2023-10-31 DIAGNOSIS — Z9884 Bariatric surgery status: Secondary | ICD-10-CM | POA: Diagnosis not present

## 2023-10-31 DIAGNOSIS — Z23 Encounter for immunization: Secondary | ICD-10-CM

## 2023-10-31 NOTE — Progress Notes (Signed)
 Established Patient Office Visit  Subjective   Patient ID: Courtney Mcgee, female    DOB: [birth year]  Age: 44 y.o. MRN: [id number]  Chief Complaint  Patient presents with   Weight Loss    HPI  Weight management Pt reports she got gastric sleeve and she is down 60 lbs since August 2024; starting weight 320 lbs.  She has been following up with her surgeon in [place], [place] but they are out of network. She walks a lot at home. She was told that she needs to eat carbs more. She is looking for help to maintain her weight.   She would like flu vaccine today.  Review of Systems  Constitutional:        Weight gain  All other systems reviewed and are negative.     Objective:     BP 118/76   Pulse 67   Temp 98 F (36.7 C) (Oral)   Resp 18   Ht 6' (1.829 m)   Wt 259 lb (117.5 kg)   LMP 08/18/2013   SpO2 99%   BMI 35.13 kg/m    Physical Exam Vitals and nursing note reviewed.  Constitutional:      Appearance: Normal appearance. She is obese.  HENT:     Head: Normocephalic and atraumatic.     Right Ear: External ear normal.     Left Ear: External ear normal.     Nose: Nose normal.     Mouth/Throat:     Mouth: Mucous membranes are moist.     Pharynx: Oropharynx is clear.  Eyes:     Conjunctiva/sclera: Conjunctivae normal.     Pupils: Pupils are equal, round, and reactive to light.  Cardiovascular:     Rate and Rhythm: Normal rate.  Pulmonary:     Effort: Pulmonary effort is normal.  Skin:    General: Skin is warm.     Capillary Refill: Capillary refill takes less than 2 seconds.  Neurological:     General: No focal deficit present.     Mental Status: She is alert and oriented to person, place, and time. Mental status is at baseline.  Psychiatric:        Mood and Affect: Mood normal.        Behavior: Behavior normal.        Thought Content: Thought content normal.        Judgment: Judgment normal.      No results found for any visits on 10/31/23.    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   Relevant Orders   Amb Referral to Bariatric Surgery   Other Visit Diagnoses       Need for influenza vaccination    -  Primary   Relevant Orders   Flu vaccine trivalent PF, 6mos and older(Flulaval,Afluria,Fluarix,Fluzone) (Completed)     Weight gain       Relevant Orders   Amb Referral to Bariatric Surgery     BMI 35.0-35.9,adult       Relevant Orders   Amb Referral to Bariatric Surgery     Need for influenza vaccination -     Flu vaccine trivalent PF, 6mos and older(Flulaval,Afluria,Fluarix,Fluzone)  Weight gain -  Amb Referral to Bariatric Surgery  BMI 35.0-35.9,adult -     Amb Referral to Bariatric Surgery  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 Previous bariatric surgeon in [place] and she would like to establish care with someone in town. Referral placed. Flu vaccine today No follow-ups on file.    Torrence CINDERELLA Barrier, MD

## 2023-11-10 DIAGNOSIS — L732 Hidradenitis suppurativa: Secondary | ICD-10-CM | POA: Diagnosis not present

## 2023-11-10 DIAGNOSIS — M545 Low back pain, unspecified: Secondary | ICD-10-CM | POA: Diagnosis not present

## 2023-11-10 DIAGNOSIS — Z9884 Bariatric surgery status: Secondary | ICD-10-CM | POA: Diagnosis not present

## 2023-11-14 DIAGNOSIS — E669 Obesity, unspecified: Secondary | ICD-10-CM | POA: Diagnosis not present

## 2023-11-14 DIAGNOSIS — Z903 Acquired absence of stomach [part of]: Secondary | ICD-10-CM | POA: Diagnosis not present

## 2023-12-08 DIAGNOSIS — Z903 Acquired absence of stomach [part of]: Secondary | ICD-10-CM | POA: Diagnosis not present

## 2023-12-08 DIAGNOSIS — Z6836 Body mass index (BMI) 36.0-36.9, adult: Secondary | ICD-10-CM | POA: Diagnosis not present

## 2023-12-15 DIAGNOSIS — E569 Vitamin deficiency, unspecified: Secondary | ICD-10-CM | POA: Diagnosis not present

## 2023-12-15 DIAGNOSIS — Z9884 Bariatric surgery status: Secondary | ICD-10-CM | POA: Diagnosis not present

## 2024-01-05 DIAGNOSIS — L700 Acne vulgaris: Secondary | ICD-10-CM | POA: Diagnosis not present

## 2024-01-05 DIAGNOSIS — L732 Hidradenitis suppurativa: Secondary | ICD-10-CM | POA: Diagnosis not present

## 2024-01-05 DIAGNOSIS — L219 Seborrheic dermatitis, unspecified: Secondary | ICD-10-CM | POA: Diagnosis not present

## 2024-01-29 DIAGNOSIS — M4726 Other spondylosis with radiculopathy, lumbar region: Secondary | ICD-10-CM | POA: Diagnosis not present

## 2024-01-29 DIAGNOSIS — M48061 Spinal stenosis, lumbar region without neurogenic claudication: Secondary | ICD-10-CM | POA: Diagnosis not present

## 2024-01-29 DIAGNOSIS — G894 Chronic pain syndrome: Secondary | ICD-10-CM | POA: Diagnosis not present

## 2024-01-29 DIAGNOSIS — M51362 Other intervertebral disc degeneration, lumbar region with discogenic back pain and lower extremity pain: Secondary | ICD-10-CM | POA: Diagnosis not present

## 2024-02-10 DIAGNOSIS — Z903 Acquired absence of stomach [part of]: Secondary | ICD-10-CM | POA: Diagnosis not present

## 2024-02-10 DIAGNOSIS — Z6836 Body mass index (BMI) 36.0-36.9, adult: Secondary | ICD-10-CM | POA: Diagnosis not present
